# Patient Record
Sex: Female | Born: 1995 | Race: White | Hispanic: No | Marital: Single | Smoking: Current every day smoker
Health system: Southern US, Community
[De-identification: ages and names within clinical notes are randomized; demographics above are authoritative.]

## PROBLEM LIST (undated history)

## (undated) DIAGNOSIS — F32A Depression, unspecified: Secondary | ICD-10-CM

## (undated) DIAGNOSIS — T7840XA Allergy, unspecified, initial encounter: Secondary | ICD-10-CM

## (undated) DIAGNOSIS — J302 Other seasonal allergic rhinitis: Secondary | ICD-10-CM

## (undated) DIAGNOSIS — J45909 Unspecified asthma, uncomplicated: Secondary | ICD-10-CM

## (undated) DIAGNOSIS — F419 Anxiety disorder, unspecified: Secondary | ICD-10-CM

## (undated) HISTORY — DX: Anxiety disorder, unspecified: F41.9

## (undated) HISTORY — DX: Depression, unspecified: F32.A

## (undated) HISTORY — DX: Allergy, unspecified, initial encounter: T78.40XA

## (undated) HISTORY — PX: ADENOIDECTOMY: SUR15

## (undated) HISTORY — PX: TONSILLECTOMY: SUR1361

---
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
## 2017-09-07 ENCOUNTER — Other Ambulatory Visit: Payer: Self-pay

## 2017-09-07 ENCOUNTER — Encounter (HOSPITAL_COMMUNITY): Payer: Self-pay

## 2017-09-07 ENCOUNTER — Ambulatory Visit (HOSPITAL_COMMUNITY)
Admission: RE | Admit: 2017-09-07 | Discharge: 2017-09-07 | Disposition: A | Discharge status: Home / Self Care | Payer: Self-pay | Attending: Psychiatry | Admitting: Psychiatry

## 2017-09-07 ENCOUNTER — Emergency Department (HOSPITAL_COMMUNITY)
Admission: EM | Admit: 2017-09-07 | Discharge: 2017-09-08 | Disposition: A | Discharge status: Other Acute Inpatient Hospital | Payer: Medicaid Other | Attending: Emergency Medicine | Admitting: Emergency Medicine

## 2017-09-07 DIAGNOSIS — F1410 Cocaine abuse, uncomplicated: Secondary | ICD-10-CM | POA: Insufficient documentation

## 2017-09-07 DIAGNOSIS — T50902A Poisoning by unspecified drugs, medicaments and biological substances, intentional self-harm, initial encounter: Secondary | ICD-10-CM | POA: Insufficient documentation

## 2017-09-07 DIAGNOSIS — F1994 Other psychoactive substance use, unspecified with psychoactive substance-induced mood disorder: Secondary | ICD-10-CM

## 2017-09-07 DIAGNOSIS — F322 Major depressive disorder, single episode, severe without psychotic features: Secondary | ICD-10-CM | POA: Insufficient documentation

## 2017-09-07 LAB — COMPREHENSIVE METABOLIC PANEL
ALBUMIN: 4.1 g/dL (ref 3.5–5.0)
ALK PHOS: 41 U/L (ref 38–126)
ALT: 14 U/L (ref 14–54)
AST: 23 U/L (ref 15–41)
Anion gap: 12 (ref 5–15)
BILIRUBIN TOTAL: 0.5 mg/dL (ref 0.3–1.2)
BUN: 5 mg/dL — AB (ref 6–20)
CO2: 23 mmol/L (ref 22–32)
CREATININE: 0.83 mg/dL (ref 0.44–1.00)
Calcium: 9.2 mg/dL (ref 8.9–10.3)
Chloride: 105 mmol/L (ref 101–111)
GFR calc Af Amer: >60 mL/min (ref >60)
GLUCOSE: 95 mg/dL (ref 65–99)
POTASSIUM: 3.4 mmol/L — AB (ref 3.5–5.1)
Sodium: 140 mmol/L (ref 135–145)
TOTAL PROTEIN: 6.8 g/dL (ref 6.5–8.1)

## 2017-09-07 LAB — I-STAT BETA HCG BLOOD, ED (MC, WL, AP ONLY)

## 2017-09-07 LAB — CBC
HEMATOCRIT: 42.4 % (ref 36.0–46.0)
Hemoglobin: 14.3 g/dL (ref 12.0–15.0)
MCH: 30 pg (ref 26.0–34.0)
MCHC: 33.7 g/dL (ref 30.0–36.0)
MCV: 88.9 fL (ref 78.0–100.0)
PLATELETS: 288 10*3/uL (ref 150–400)
RBC: 4.77 MIL/uL (ref 3.87–5.11)
RDW: 13.6 % (ref 11.5–15.5)
WBC: 9.6 10*3/uL (ref 4.0–10.5)

## 2017-09-07 LAB — I-STAT TROPONIN, ED: Troponin i, poc: 0 ng/mL (ref 0.00–0.08)

## 2017-09-07 LAB — ETHANOL

## 2017-09-07 LAB — SALICYLATE LEVEL: Salicylate Lvl: <7 mg/dL (ref 2.8–30.0)

## 2017-09-07 LAB — RAPID URINE DRUG SCREEN, HOSP PERFORMED
AMPHETAMINES: NOT DETECTED
BARBITURATES: NOT DETECTED
BENZODIAZEPINES: NOT DETECTED
Cocaine: NOT DETECTED
Opiates: NOT DETECTED
Tetrahydrocannabinol: POSITIVE — AB

## 2017-09-07 LAB — ACETAMINOPHEN LEVEL: Acetaminophen (Tylenol), Serum: <10 ug/mL — ABNORMAL LOW (ref 10–30)

## 2017-09-07 LAB — CK: Total CK: 137 U/L (ref 38–234)

## 2017-09-07 MED ORDER — SODIUM CHLORIDE 0.9 % IV BOLUS (SEPSIS)
1000.0000 mL | Freq: Once | INTRAVENOUS | Status: AC
Start: 2017-09-07 — End: 2017-09-07
  Administered 2017-09-07: 1000 mL via INTRAVENOUS

## 2017-09-07 MED ORDER — SODIUM CHLORIDE 0.9 % IV BOLUS (SEPSIS)
1000.0000 mL | Freq: Once | INTRAVENOUS | Status: AC
  Administered 2017-09-07: 1000 mL via INTRAVENOUS

## 2017-09-07 MED ORDER — ACETAMINOPHEN 325 MG PO TABS: 650.0000 mg | ORAL_TABLET | ORAL | Status: DC | PRN

## 2017-09-07 MED ORDER — THIAMINE HCL 100 MG/ML IJ SOLN: 100.0000 mg | Freq: Every day | INTRAMUSCULAR | Status: DC

## 2017-09-07 MED ORDER — LORAZEPAM 1 MG PO TABS: 0.0000 mg | ORAL_TABLET | Freq: Two times a day (BID) | ORAL | Status: DC

## 2017-09-07 MED ORDER — LORAZEPAM 2 MG/ML IJ SOLN: 0.0000 mg | Freq: Four times a day (QID) | INTRAMUSCULAR | Status: DC

## 2017-09-07 MED ORDER — LORAZEPAM 1 MG PO TABS
0.0000 mg | ORAL_TABLET | Freq: Four times a day (QID) | ORAL | Status: DC
  Administered 2017-09-08: 1 mg via ORAL
  Filled 2017-09-07: qty 1

## 2017-09-07 MED ORDER — BUPROPION HCL ER (XL) 150 MG PO TB24: 150.0000 mg | ORAL_TABLET | Freq: Every day | ORAL | Status: DC

## 2017-09-07 MED ORDER — LORAZEPAM 2 MG/ML IJ SOLN
1.0000 mg | Freq: Once | INTRAMUSCULAR | Status: AC
  Administered 2017-09-07: 1 mg via INTRAVENOUS
  Filled 2017-09-07: qty 1

## 2017-09-07 MED ORDER — FLUOXETINE HCL 20 MG PO CAPS: 20.0000 mg | ORAL_CAPSULE | Freq: Every day | ORAL | Status: DC

## 2017-09-07 MED ORDER — LORAZEPAM 2 MG/ML IJ SOLN: 0.0000 mg | Freq: Two times a day (BID) | INTRAMUSCULAR | Status: DC

## 2017-09-07 MED ORDER — VITAMIN B-1 100 MG PO TABS
100.0000 mg | ORAL_TABLET | Freq: Every day | ORAL | Status: DC
  Administered 2017-09-08: 100 mg via ORAL
  Filled 2017-09-07: qty 1

## 2017-09-07 NOTE — ED Notes (Signed)
[id number] Expected date:  Expected time:  Means of arrival:  Comments: Rm 11

## 2017-09-07 NOTE — ED Notes (Signed)
Bed: [id number] Expected date:  Expected time:  Means of arrival:  Comments: [facility]-medical clearance

## 2017-09-07 NOTE — ED Provider Notes (Signed)
[facility]-EMERGENCY DEPT Provider Note   CSN: [id number] Arrival date & time: 09/07/17  1753     History   Chief Complaint Chief Complaint  Patient presents with  . Drug / Alcohol Assessment  . Suicidal    HPI Desiree Berger is a 21 y.o. female with a history of suicide attempt at age 21 presents today for evaluation of a suicide attempt.  She reports that this morning she snorted her Wellbutrin instead of taking it p.o. and took "a lot of cocaine" in an attempt to kill herself.  She reports that she felt like she was going to die so she is convinced the cocaine was laced with fentanyl.  She reports some mild chest pain on the left anterior chest and feels like she is having palpitations.  She would not elaborate on why she wanted to kill herself today.  Her mom reports that she is concerned something may have happened on Halloween because since then she has been acting without regard for others.    Patient reports that she uses "a lot" of cocaine in a daily basis.  She says that her ex-boyfriend used to give it to her, however notes that it was willing and not against her will.  She says that she drinks approximately 50 large beers a week.  She reports that marijuana is her favorite drug and she smokes it daily however she also takes cocaine regularly.  She says that this morning she snorted her welbutrin.  She denies any opioid use.  She denies any shortness of breath or abdominal pain.  Remainder of history limited secondary to patient being extremely labile.   HPI  Past Medical History:  Diagnosis Date  . Asthma   . Seasonal allergies     Patient Active Problem List   Diagnosis Date Noted  . MDD (major depressive disorder), single episode, severe (HCC) 01/16/2014  . GAD (generalized anxiety disorder) 01/16/2014  . Closed fracture of left toe 04/14/2012    Past Surgical History:  Procedure Laterality Date  . ADENOIDECTOMY    .  TONSILLECTOMY      OB History    No data available       Home Medications    Prior to Admission medications   Medication Sig Start Date End Date Taking? Authorizing Provider  buPROPion (WELLBUTRIN XL) 150 MG 24 hr tablet Take 150 mg daily by mouth. 08/17/17  Yes [provider]  etonogestrel (NEXPLANON) 68 MG IMPL implant 1 each once by Subdermal route.   Yes [provider]  FLUoxetine (PROZAC) 20 MG capsule  08/15/17  Yes [provider]  mirtazapine (REMERON) 15 MG tablet Take 1 tablet (15 mg total) by mouth at bedtime. Patient not taking: Reported on 09/07/2017 01/22/14   Jolene SchimkeWinson, Kim B, NP    Family History Family History  Problem Relation Age of Onset  . Hypertension Mother   . Mental illness Mother   . Hyperlipidemia Father   . Birth defects Maternal Grandmother   . Heart attack Neg Hx   . Diabetes Neg Hx   . Sudden death Neg Hx     Social History Social History   Tobacco Use  . Smoking status: Passive Smoke Exposure - Never Smoker  . Smokeless tobacco: Never Used  Substance Use Topics  . Alcohol use: No  . Drug use: Yes    Types: Cocaine    Comment: Stated cocaine ingestion x 2 hours.     Allergies  Patient has no known allergies.   Review of Systems Review of Systems  Constitutional: Negative for chills and fever.  HENT: Negative for ear pain and sore throat.   Eyes: Negative for pain and visual disturbance.  Respiratory: Negative for cough and shortness of breath.   Cardiovascular: Positive for chest pain and palpitations.  Gastrointestinal: Negative for abdominal pain and vomiting.  Genitourinary: Negative for dysuria and hematuria.  Musculoskeletal: Negative for arthralgias and back pain.  Skin: Negative for color change and rash.  Neurological: Negative for seizures, syncope and headaches.  Psychiatric/Behavioral: Positive for confusion, decreased concentration, self-injury, sleep disturbance and suicidal ideas. The  patient is nervous/anxious.   All other systems reviewed and are negative.    Physical Exam Updated Vital Signs BP (!) 154/86   Pulse 81   Temp 99.1 F (37.3 C) (Oral)   Resp (!) 22   Ht 5\' 5"  (1.651 m)   Wt 81.6 kg (180 lb)   SpO2 99%   BMI 29.95 kg/m   Physical Exam  Constitutional: She appears well-developed and well-nourished. No distress.  HENT:  Head: Normocephalic and atraumatic.  Eyes: Conjunctivae are normal.  Neck: Neck supple.  Cardiovascular: Regular rhythm, normal heart sounds, intact distal pulses and normal pulses. Tachycardia present. Exam reveals no friction rub.  No murmur heard. Pulmonary/Chest: Effort normal and breath sounds normal. No respiratory distress.  Abdominal: Soft. There is no tenderness.  Musculoskeletal: She exhibits no edema.  Neurological: She is alert.  Skin: Skin is warm and dry.  Psychiatric: Her mood appears anxious. Her affect is angry, labile and inappropriate. Her speech is rapid and/or pressured. She is agitated and hyperactive. She expresses suicidal ideation. She expresses no homicidal ideation. She expresses suicidal plans. She expresses no homicidal plans. She exhibits abnormal recent memory.  Nursing note and vitals reviewed.    ED Treatments / Results  Labs (all labs ordered are listed, but only abnormal results are displayed) Labs Reviewed  COMPREHENSIVE METABOLIC PANEL - Abnormal; Notable for the following components:      Result Value   Potassium 3.4 (*)    BUN 5 (*)    All other components within normal limits  ACETAMINOPHEN LEVEL - Abnormal; Notable for the following components:   Acetaminophen (Tylenol), Serum <10 (*)    All other components within normal limits  RAPID URINE DRUG SCREEN, HOSP PERFORMED - Abnormal; Notable for the following components:   Tetrahydrocannabinol POSITIVE (*)    All other components within normal limits  ETHANOL  SALICYLATE LEVEL  CBC  CK  I-STAT BETA HCG BLOOD, ED ([facility], [facility], [facility]  ONLY)  I-STAT TROPONIN, ED    EKG  EKG Interpretation None       Radiology No results found.  Procedures Procedures (including critical care time)  Medications Ordered in ED Medications  LORazepam (ATIVAN) injection 1 mg (1 mg Intravenous Given 09/07/17 1859)  sodium chloride 0.9 % bolus 1,000 mL (0 mLs Intravenous Stopped 09/07/17 1949)  sodium chloride 0.9 % bolus 1,000 mL (1,000 mLs Intravenous New Bag/Given 09/07/17 2041)     Initial Impression / Assessment and Plan / ED Course  I have reviewed the triage vital signs and the nursing notes.  Pertinent labs & imaging results that were available during my care of the patient were reviewed by me and considered in my medical decision making (see chart for details).  Clinical Course as of Sep 07 2233  Tue Sep 07, 2017  2232 , She is resting comfortably at  this time, she is no longer tachycardic.  Patiently medically cleared for psychiatric evaluation and placement.  [EH]    Clinical Course User Index [EH] Cristina GongHammond, Jaide Hillenburg W, PA-C   Desiree BrodKatherine Michelle Johanson presents today for evaluation after a reported suicide attempt.  She reports that she attempted to kill herself today by ingesting a large amount of what she thought was cocaine.  Initially she was mildly tachycardic, afebrile, normotensive.  Screening labs were obtained, UDS negative for cocaine, positive for marijuana.  While she reports that she drinks approximately 50 beers a week we will place her on CIWA protocol.  She denies any homicidal ideations, or AVH.  At this time she does not have any physical complaints or concerns.  Her tachycardia was treated with Ativan, and 2 L normal saline which resolved her tachycardia.  Patient was brought here by family and [employer].  She is here voluntarily but if wants to leave will need IVC as she took what she thought would be a fatal dose of cocaine this morning.   Final Clinical Impressions(s) / ED Diagnoses   Final diagnoses:    Suicide attempt by drug ingestion, initial encounter [facility](HCC)    ED Discharge Orders    None       Cristina GongHammond, Zemira Zehring W, PA-C 09/07/17 2238    Cristina GongHammond, Tyauna Lacaze W, PA-C 09/07/17 2239    Tegeler, Canary Brimhristopher J, MD 09/08/17 0126

## 2017-09-07 NOTE — ED Notes (Signed)
Pt presents with SI, plan to OD.  Snorted Wellbutrin today.  No sleep in 3 days.  History of Anxiety DO and Major Depressive DO.  A&O x 3, no distress noted, calm & cooperative. Monitoring for safety, Q 15 min checks in effect.  Safety check for contraband completed, no items found.  Pt being assessed by TTS Thurston Poundsrey at present.
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
## 2017-09-07 NOTE — ED Notes (Signed)
Pt reported that she "SNORTED her Wellbutrin tablet".

## 2017-09-07 NOTE — ED Triage Notes (Addendum)
Pt arrived escorted by [employer] after stating she took cocaine earlier and became combative. Pt states she has suicide plan and wishes to commit suicide by overdose.

## 2017-09-07 NOTE — ED Notes (Signed)
Pt unable to give urine sample, but aware that we need one.  
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
## 2017-09-08 ENCOUNTER — Encounter (HOSPITAL_COMMUNITY): Payer: Self-pay

## 2017-09-08 ENCOUNTER — Inpatient Hospital Stay (HOSPITAL_COMMUNITY)
Admission: AD | Admit: 2017-09-08 | Discharge: 2017-09-13 | DRG: 885 | Disposition: A | Discharge status: Home / Self Care | Payer: Medicaid Other | Attending: Psychiatry | Admitting: Psychiatry

## 2017-09-08 ENCOUNTER — Other Ambulatory Visit: Payer: Self-pay

## 2017-09-08 DIAGNOSIS — F22 Delusional disorders: Secondary | ICD-10-CM | POA: Diagnosis not present

## 2017-09-08 DIAGNOSIS — F1994 Other psychoactive substance use, unspecified with psychoactive substance-induced mood disorder: Secondary | ICD-10-CM | POA: Diagnosis present

## 2017-09-08 DIAGNOSIS — R441 Visual hallucinations: Secondary | ICD-10-CM | POA: Diagnosis not present

## 2017-09-08 DIAGNOSIS — R4587 Impulsiveness: Secondary | ICD-10-CM | POA: Diagnosis not present

## 2017-09-08 DIAGNOSIS — Z79899 Other long term (current) drug therapy: Secondary | ICD-10-CM | POA: Diagnosis not present

## 2017-09-08 DIAGNOSIS — G4700 Insomnia, unspecified: Secondary | ICD-10-CM | POA: Diagnosis present

## 2017-09-08 DIAGNOSIS — Z818 Family history of other mental and behavioral disorders: Secondary | ICD-10-CM

## 2017-09-08 DIAGNOSIS — R440 Auditory hallucinations: Secondary | ICD-10-CM

## 2017-09-08 DIAGNOSIS — F419 Anxiety disorder, unspecified: Secondary | ICD-10-CM | POA: Diagnosis not present

## 2017-09-08 DIAGNOSIS — F333 Major depressive disorder, recurrent, severe with psychotic symptoms: Principal | ICD-10-CM | POA: Diagnosis present

## 2017-09-08 DIAGNOSIS — F17210 Nicotine dependence, cigarettes, uncomplicated: Secondary | ICD-10-CM | POA: Diagnosis present

## 2017-09-08 DIAGNOSIS — J45909 Unspecified asthma, uncomplicated: Secondary | ICD-10-CM | POA: Diagnosis present

## 2017-09-08 DIAGNOSIS — R451 Restlessness and agitation: Secondary | ICD-10-CM | POA: Diagnosis not present

## 2017-09-08 DIAGNOSIS — K5900 Constipation, unspecified: Secondary | ICD-10-CM | POA: Diagnosis not present

## 2017-09-08 DIAGNOSIS — R45851 Suicidal ideations: Secondary | ICD-10-CM

## 2017-09-08 DIAGNOSIS — R450 Nervousness: Secondary | ICD-10-CM

## 2017-09-08 DIAGNOSIS — R4582 Worries: Secondary | ICD-10-CM | POA: Diagnosis not present

## 2017-09-08 DIAGNOSIS — Z6379 Other stressful life events affecting family and household: Secondary | ICD-10-CM | POA: Diagnosis not present

## 2017-09-08 DIAGNOSIS — F411 Generalized anxiety disorder: Secondary | ICD-10-CM | POA: Diagnosis present

## 2017-09-08 DIAGNOSIS — F1410 Cocaine abuse, uncomplicated: Secondary | ICD-10-CM

## 2017-09-08 DIAGNOSIS — F1290 Cannabis use, unspecified, uncomplicated: Secondary | ICD-10-CM | POA: Diagnosis present

## 2017-09-08 DIAGNOSIS — R4586 Emotional lability: Secondary | ICD-10-CM

## 2017-09-08 MED ORDER — HYDROXYZINE HCL 25 MG PO TABS
25.0000 mg | ORAL_TABLET | Freq: Three times a day (TID) | ORAL | Status: DC
  Administered 2017-09-08 – 2017-09-10 (×6): 25 mg via ORAL
  Filled 2017-09-08 (×9): qty 1

## 2017-09-08 MED ORDER — TRAZODONE HCL 50 MG PO TABS
50.0000 mg | ORAL_TABLET | Freq: Every evening | ORAL | Status: DC | PRN
  Administered 2017-09-09 – 2017-09-12 (×4): 50 mg via ORAL
  Filled 2017-09-08 (×3): qty 1

## 2017-09-08 MED ORDER — RISPERIDONE 0.5 MG PO TABS
0.5000 mg | ORAL_TABLET | Freq: Every day | ORAL | Status: DC
  Administered 2017-09-08: 0.5 mg via ORAL
  Filled 2017-09-08: qty 1

## 2017-09-08 MED ORDER — CITALOPRAM HYDROBROMIDE 10 MG PO TABS
20.0000 mg | ORAL_TABLET | Freq: Every day | ORAL | Status: DC
  Administered 2017-09-08: 20 mg via ORAL
  Filled 2017-09-08: qty 2

## 2017-09-08 MED ORDER — MAGNESIUM HYDROXIDE 400 MG/5ML PO SUSP
30.0000 mL | Freq: Every day | ORAL | Status: DC | PRN
  Administered 2017-09-11: 30 mL via ORAL
  Filled 2017-09-08: qty 30

## 2017-09-08 MED ORDER — RISPERIDONE 0.5 MG PO TABS
0.5000 mg | ORAL_TABLET | Freq: Every day | ORAL | Status: DC
  Filled 2017-09-08 (×2): qty 1

## 2017-09-08 MED ORDER — RISPERIDONE 1 MG PO TABS: 1.0000 mg | ORAL_TABLET | Freq: Every day | ORAL | Status: DC

## 2017-09-08 MED ORDER — ACETAMINOPHEN 325 MG PO TABS: 650.0000 mg | ORAL_TABLET | Freq: Four times a day (QID) | ORAL | Status: DC | PRN

## 2017-09-08 MED ORDER — ALUM & MAG HYDROXIDE-SIMETH 200-200-20 MG/5ML PO SUSP: 30.0000 mL | ORAL | Status: DC | PRN

## 2017-09-08 MED ORDER — CITALOPRAM HYDROBROMIDE 20 MG PO TABS
20.0000 mg | ORAL_TABLET | Freq: Every day | ORAL | Status: DC
  Administered 2017-09-09 – 2017-09-13 (×5): 20 mg via ORAL
  Filled 2017-09-08 (×8): qty 1

## 2017-09-08 NOTE — Progress Notes (Signed)
Pt did not attend wrap-up group, is sleeping.
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
## 2017-09-08 NOTE — ED Notes (Signed)
[facility].

## 2017-09-08 NOTE — Progress Notes (Deleted)
09/08/17 1405:  Pt was sitting in dayroom watching television.  LRT introduced self and offered activities.  Pt was quiet but would respond when prompted.  Pt seemed guarded but appropriate.  LRT and pt played cards for awhile.  Pt requested mermaid and princess coloring sheets for later.   Caroll RancherMarjette Asencion Guisinger, LRT/CTRS

## 2017-09-08 NOTE — ED Notes (Signed)
[facility]. She was calm and cooperative. All belongings returned to pt who signed for them.

## 2017-09-08 NOTE — Progress Notes (Signed)
09/08/17 1404:  Pt sleeping in room.   Caroll RancherMarjette Haley Roza, LRT/CTRS
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
## 2017-09-08 NOTE — BH Assessment (Addendum)
[facility] Assessment Progress Note  Per Juanetta BeetsJacqueline Norman, DO, this pt requires psychiatric hospitalization.  Malva LimesLinsey Strader, RN, [facility]; they will call when they are ready to receive pt.  Dr Sharma CovertNorman also finds that pt meeds criteria for IVC, which she has initiated.  [place], and at 13:07 Hart CarwinMagistrate Haynes confirms receipt.  As of this writing, service of Findings and Custody Order is pending. Other [facility].  Pt's nurse, Diane, has been notified, and agrees to call report to [phone].  Pt is to be transported via [employer] examinerlaw enforcement when the time comes.   Doylene Canninghomas Thaddaeus Granja, MA Triage Specialist [phone]   Addendum:  Per Richelle ItoLinsey, [facility] between 16:30 and 17:00.  Laveda AbbeLaurie Britton Parks, FNP has been notified, as well as pt's nurse, Diane.  Doylene Canninghomas Enaya Howze, MA Triage Specialist [phone]
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
## 2017-09-08 NOTE — ED Notes (Signed)
Pt is anxious and cries easily. Medication given as well as therapeutic communication. Her mother and other family members are supportive and come to visit her. Pt is cooperative. She mostly stays in her room and watches TV.

## 2017-09-08 NOTE — Progress Notes (Signed)
Pt sleeping in bed. Will continue to monitor.

## 2017-09-08 NOTE — Tx Team (Signed)
Initial Treatment Plan 09/08/2017 6:20 PM Desiree BrodKatherine Michelle Heney EXB:[id number]    PATIENT STRESSORS: Legal issue Substance abuse Traumatic event   PATIENT STRENGTHS: Communication skills General fund of knowledge Physical Health   PATIENT IDENTIFIED PROBLEMS: Substance abuse  Suicidal ideation  Depression  "Have help for addiction problem"  "Help with suicidal thoughts"  "To be able to go through something without wanting to jump off bridge"           DISCHARGE CRITERIA:  Ability to meet basic life and health needs Improved stabilization in mood, thinking, and/or behavior Need for constant or close observation no longer present Verbal commitment to aftercare and medication compliance Withdrawal symptoms are absent or subacute and managed without 24-hour nursing intervention  PRELIMINARY DISCHARGE PLAN: Outpatient therapy Medication management  PATIENT/FAMILY INVOLVEMENT: This treatment plan has been presented to and reviewed with the patient, Desiree Berger.  The patient and family have been given the opportunity to ask questions and make suggestions.  Levin BaconHeather V Bettina Warn, RN 09/08/2017, 6:20 PM

## 2017-09-09 ENCOUNTER — Ambulatory Visit (HOSPITAL_COMMUNITY): Payer: Self-pay

## 2017-09-09 DIAGNOSIS — F419 Anxiety disorder, unspecified: Secondary | ICD-10-CM

## 2017-09-09 DIAGNOSIS — F17210 Nicotine dependence, cigarettes, uncomplicated: Secondary | ICD-10-CM

## 2017-09-09 DIAGNOSIS — R4587 Impulsiveness: Secondary | ICD-10-CM

## 2017-09-09 DIAGNOSIS — R4582 Worries: Secondary | ICD-10-CM

## 2017-09-09 DIAGNOSIS — G4700 Insomnia, unspecified: Secondary | ICD-10-CM

## 2017-09-09 DIAGNOSIS — Z6379 Other stressful life events affecting family and household: Secondary | ICD-10-CM

## 2017-09-09 DIAGNOSIS — F333 Major depressive disorder, recurrent, severe with psychotic symptoms: Principal | ICD-10-CM

## 2017-09-09 DIAGNOSIS — R4586 Emotional lability: Secondary | ICD-10-CM

## 2017-09-09 DIAGNOSIS — F1994 Other psychoactive substance use, unspecified with psychoactive substance-induced mood disorder: Secondary | ICD-10-CM

## 2017-09-09 DIAGNOSIS — R450 Nervousness: Secondary | ICD-10-CM

## 2017-09-09 DIAGNOSIS — R440 Auditory hallucinations: Secondary | ICD-10-CM

## 2017-09-09 DIAGNOSIS — R441 Visual hallucinations: Secondary | ICD-10-CM

## 2017-09-09 DIAGNOSIS — R45851 Suicidal ideations: Secondary | ICD-10-CM

## 2017-09-09 LAB — LIPID PANEL
Cholesterol: 145 mg/dL (ref 0–200)
HDL: 39 mg/dL — ABNORMAL LOW (ref >40)
LDL CALC: 82 mg/dL (ref 0–99)
Total CHOL/HDL Ratio: 3.7 RATIO
Triglycerides: 122 mg/dL (ref <150)
VLDL: 24 mg/dL (ref 0–40)

## 2017-09-09 MED ORDER — RISPERIDONE 0.5 MG PO TABS
0.5000 mg | ORAL_TABLET | Freq: Two times a day (BID) | ORAL | Status: DC
  Administered 2017-09-09 – 2017-09-10 (×3): 0.5 mg via ORAL
  Filled 2017-09-09 (×7): qty 1
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
## 2017-09-09 NOTE — Social Work (Signed)
Referred to [facility], is [place] resident.  Anne Cunningham, LCSW Lead Clinical Social Worker Phone:  [phone]  

## 2017-09-09 NOTE — [facility] Suicide Risk Assessment (Signed)
[facility] INPATIENT:  Family/Significant Other Suicide Prevention Education  Suicide Prevention Education:  Education Completed; Desiree HanksMichelle Berger [phone] (Mother) has been identified by the patient as the family member/significant other with whom the patient will be residing, and identified as the person(s) who will aid the patient in the event of a mental health crisis (suicidal ideations/suicide attempt).  With written consent from the patient, the family member/significant other has been provided the following suicide prevention education, prior to the and/or following the discharge of the patient.  The suicide prevention education provided includes the following:  Suicide risk factors  Suicide prevention and interventions  National Suicide Hotline telephone number  [facility] assessment telephone number  [facility] Emergency Assistance 911  [facility] and/or Residential Mobile Crisis Unit telephone number  Request made of family/significant other to:  Remove weapons (e.g., guns, rifles, knives), all items previously/currently identified as safety concern.    Remove drugs/medications (over-the-counter, prescriptions, illicit drugs), all items previously/currently identified as a safety concern.  The family member/significant other verbalizes understanding of the suicide prevention education information provided.  The family member/significant other agrees to remove the items of safety concern listed above.  Desiree HanksMichelle Berger (Mother) states that she believes her daughter has a significant drug problem.  She shared that Desiree LeatherwoodKatherine has been taking Prozac with alcohol.  She states her daughter has told her that she has taken Cocaine, Xanax, Marijuana.  Her mother feels that she may have been taking Methamphetamine as well due to Desiree Berger's rapid weight loss.  Desiree HanksMichelle Berger states that she has found capsules, a spoon, baggies and baby powder hidden in Foot LockerKatherine's dresser  drawer.  Desiree HanksMichelle Berger often hides her medication on her body to keep it hidden from her daughter.  According to her mother Desiree LeatherwoodKatherine is stealing, lying, shows no remorse for her actions, blames things on others, and has crying spells.  He mother also states that she has found semi-nude photos of her daughter on her phone and that she has been contacting various men off the Internet.  Her mother worries that she has a personality disorder or another mental health concern.    Her twin brother, Desiree Berger, called to share that when he brought his sister to the hospital she was acting strangely and he asked her if she had taken "blow".  She told him yes.  However, a friend contacted him Wednesday and informed him that what she was given was actually Methamphetamines.  He states that when he brought her to the hospital she had not slept in 3 days.     Desiree Berger 09/09/2017, 10:42 AM

## 2017-09-09 NOTE — Progress Notes (Signed)
Recreation Therapy Notes  Date: 09/09/17 Time: 1015 Location: [address] Dayroom  Group Topic: Communication  Goal Area(s) Addresses:  Patient will effectively communicate with peers in group.  Patient will verbalize benefit of healthy communication. Patient will verbalize positive effect of healthy communication on post d/c goals.  Patient will identify communication techniques that made activity effective for group.   Behavioral Response: Engaged  Intervention:  Merchandiser, retailGeometrical drawings, pencils, blank paper  Activity: Back to Back Drawings.  Patients were divided into groups of 2.  One person gave the instructions and the other drew the picture according to the instructions given.  The person drawing was not allowed to ask any questions of the person giving the directions.  Patients switched roles in the second round.   Education: Communication, Discharge Planning  Education Outcome: Acknowledges understanding/In group clarification offered/Needs additional education.   Clinical Observations/Feedback: Pt expressed the main elements of communication were talking and listening.  Pt stated "knowing what you are talking about" is key to communication as well.  Pt stated it was harder being the listener because "the outside noise was distracting, so I had to strain to listen".  Pt also stated it was easier to give the instructions but she had to trace the picture with her finger to describe it correctly.  Pt did become emotional and started crying because she drew her picture close to what was described to her.  Pt stated she usually gets things wrong so it was a big deal to her to close to the picture.  Pt also stated communication is important so you can "understand the person you are talking to".   Pt expressed getting the person to repeat what you say or using your hands can help limit misunderstandings.   Caroll RancherMarjette Ghazal Berger, LRT/CTRS        Caroll RancherLindsay, [name] 09/09/2017 11:31 AM

## 2017-09-09 NOTE — Progress Notes (Signed)
D: Patient denies SI, HI or AVH. Patient presents as animated and anxious with pleasant mood.  She states she "slept like a baby" and reports that her appetite is good.  Pt. Became tearful during lunch, wanting to return to the unit.  She has been somewhat isolative because she is afraid of some of the other patients.  Pt. Reassured that she was safe, medications administered as ordered and patient resting without apparent distress at this time.  A: Patient given emotional support from RN. Patient encouraged to come to staff with concerns and/or questions. Patient's medication routine continued. Patient's orders and plan of care reviewed.   R: Patient remains appropriate and cooperative. Will continue to monitor patient q15 minutes for safety.

## 2017-09-09 NOTE — [facility]HH Suicide Risk Assessment (Signed)
[facility]   Nursing information obtained from:  Patient Demographic factors:  Caucasian, Adolescent or young adult Current Mental Status:  NA Loss Factors:  Legal issues, Financial problems / change in socioeconomic status Historical Factors:  Prior suicide attempts, Family history of suicide, Family history of mental illness or substance abuse, Impulsivity Risk Reduction Factors:  Employed, Living with another person, especially a relative  Total Time spent with patient: 45 minutes Principal Problem: MDD (major depressive disorder), recurrent, severe, with psychosis (HCC) Diagnosis:   Patient Active Problem List   Diagnosis Date Noted  . Substance induced mood disorder (HCC) [F19.94] 09/08/2017  . MDD (major depressive disorder), recurrent, severe, with psychosis (HCC) [F33.3] 09/08/2017  . MDD (major depressive disorder), single episode, severe (HCC) [F32.2] 01/16/2014  . GAD (generalized anxiety disorder) [F41.1] 01/16/2014  . Closed fracture of left toe [S92.912A] 04/14/2012   Subjective Data:  Desiree PittsKatherine Delmar is a 21 y/o F with history of MDD and GAD who was admitted on IVC with worsening depression, SI, psychosis, and illicit substance use. Pt shares her reasons for presenting initially stating, "I called the police because I thought my mom said there was a dead body under the house, but then when they came I had trouble getting my words out." Additional collateral information is that patient had expressed suicidal ideation with plan to overdose, and she confirms that she was thinking of suicide via "snorting a bunch of my wellbutrin pills." Pt endroses that she has been insufflating her wellbutrin medication for the last several months after she lost access to cocaine. She developed SI with plan to overdose in the recent days. She denies HI. She endorses AH of hearing "names switched around," "bells," and "dings." She endorses VH of seeing "shadow figures" and  "bright lights." Pt endorses depression symptoms of poor sleep with initial and middle insomnia, low energy, guilty feelings, anhedonia, low motivation, poor appetite, and suicidal thoughts. She denies symptoms of mania, OCD, and PTSD. She reports that she previously was taking wellbutrin and prozac outside the hospital, but since her admission she was started on celexa and risperdal. At time of interview, she is distressed due to ongoing AH, and we agreed to increase dose of risperdal. Pt had no further questions, comments, or concerns.   Continued Clinical Symptoms:  Alcohol Use Disorder Identification Test Final Score (AUDIT): 6 The "Alcohol Use Disorders Identification Test", Guidelines for Use in Primary Care, Second Edition.  World Science writerHealth Organization [facility](WHO). Score between 0-7:  no or low risk or alcohol related problems. Score between 8-15:  moderate risk of alcohol related problems. Score between 16-19:  high risk of alcohol related problems. Score 20 or above:  warrants further diagnostic evaluation for alcohol dependence and treatment.   CLINICAL FACTORS:   Severe Anxiety and/or Agitation Depression:   Comorbid alcohol abuse/dependence Insomnia Alcohol/Substance Abuse/Dependencies   Musculoskeletal: Strength & Muscle Tone: within normal limits Gait & Station: normal Patient leans: N/A  Psychiatric Specialty Exam: Physical Exam  Nursing note and vitals reviewed.   Review of Systems  Constitutional: Negative for chills and fever.  Respiratory: Negative for cough.   Cardiovascular: Negative for chest pain.  Gastrointestinal: Negative for abdominal pain, heartburn, nausea and vomiting.  Psychiatric/Behavioral: Positive for depression, hallucinations, substance abuse and suicidal ideas. The patient is nervous/anxious.     Blood pressure 120/77, pulse 79, temperature 98 F (36.7 C), resp. rate 18, height 5\' 5"  (1.651 m), weight 81.2 kg (179 lb), SpO2 100 %.Body mass  index is  29.79 kg/m.  General Appearance: Casual  Eye Contact:  Fair  Speech:  Clear and Coherent and Normal Rate  Volume:  Normal  Mood:  Anxious and Depressed  Affect:  Congruent and Constricted  Thought Process:  Coherent  Orientation:  Full (Time, Place, and Person)  Thought Content:  Hallucinations: Auditory Visual  Suicidal Thoughts:  Yes.  with intent/plan  Homicidal Thoughts:  No  Memory:  Immediate;   Good Recent;   Good Remote;   Good  Judgement:  Impaired  Insight:  Lacking  Psychomotor Activity:  Normal  Concentration:  Concentration: Good  Recall:  Fair  Fund of Knowledge:  Fair  Language:  Fair  Akathisia:  No  Handed:    AIMS (if indicated):     Assets:  Communication Skills Desire for Improvement Physical Health Resilience Social Support  ADL's:  Intact  Cognition:  WNL  Sleep:         COGNITIVE FEATURES THAT CONTRIBUTE TO RISK:  None    SUICIDE RISK:   Mild:  Suicidal ideation of limited frequency, intensity, duration, and specificity.  There are no identifiable plans, no associated intent, mild dysphoria and related symptoms, good self-control (both objective and subjective assessment), few other risk factors, and identifiable protective factors, including available and accessible social support.  PLAN OF CARE:  - Admit to inpatient psychiatry unit - MDD with psychotic features  - Change risperdal 0.5mg  qhs to risperdal 0.5mg  BID (first dose now)  - Continue celexa 20mg  qDay - Encourage participation in groups and the therapeutic milieu - Discharge planning will be ongoing  I certify that inpatient services furnished can reasonably be expected to improve the patient's condition.   Micheal Likenshristopher T Bruce Churilla, MD 09/09/2017, 3:32 PM
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
## 2017-09-09 NOTE — Tx Team (Signed)
Interdisciplinary Treatment and Diagnostic Plan Update  09/09/2017 Time of Session: 10:43 AM  [name] MRN: [id number]  Principal Diagnosis: Major depressive disorder, recurrent with psychosis, Generalized anxiety disorder, substance induced mood disorder, polysubstance use  Secondary Diagnoses: Active Problems:   MDD (major depressive disorder), recurrent, severe, with psychosis (HCC)   Current Medications:  Current Facility-Administered Medications  Medication Dose Route Frequency Provider Last Rate Last Dose  . acetaminophen (TYLENOL) tablet 650 mg  650 mg Oral Q6H PRN Laveda AbbeParks, Laurie Britton, NP      . alum & mag hydroxide-simeth (MAALOX/MYLANTA) 200-200-20 MG/5ML suspension 30 mL  30 mL Oral Q4H PRN Laveda AbbeParks, Laurie Britton, NP      . citalopram (CELEXA) tablet 20 mg  20 mg Oral Daily Laveda AbbeParks, Laurie Britton, NP   20 mg at 09/09/17 0846  . hydrOXYzine (ATARAX/VISTARIL) tablet 25 mg  25 mg Oral TID Laveda AbbeParks, Laurie Britton, NP   25 mg at 09/09/17 0846  . magnesium hydroxide (MILK OF MAGNESIA) suspension 30 mL  30 mL Oral Daily PRN Laveda AbbeParks, Laurie Britton, NP      . risperiDONE (RISPERDAL) tablet 0.5 mg  0.5 mg Oral QHS Laveda AbbeParks, Laurie Britton, NP      . traZODone (DESYREL) tablet 50 mg  50 mg Oral QHS PRN Laveda AbbeParks, Laurie Britton, NP        PTA Medications: Medications Prior to Admission  Medication Sig Dispense Refill Last Dose  . buPROPion (WELLBUTRIN XL) 150 MG 24 hr tablet Take 150 mg daily by mouth.  0 09/07/2017 at Unknown time  . etonogestrel (NEXPLANON) 68 MG IMPL implant 1 each once by Subdermal route.   continuous  . FLUoxetine (PROZAC) 20 MG capsule   0 09/07/2017 at Unknown time  . mirtazapine (REMERON) 15 MG tablet Take 1 tablet (15 mg total) by mouth at bedtime. (Patient not taking: Reported on 09/07/2017) 30 tablet 1 Completed Course at Unknown time    Treatment Modalities: Medication Management, Group therapy, Case management,  1 to 1 session with clinician,  Psychoeducation, Recreational therapy.  Patient Stressors: Legal issue Substance abuse Traumatic event  Patient Strengths: Wellsite geologistCommunication skills General fund of knowledge Physical Health   Physician Treatment Plan for Primary Diagnosis: Major depressive disorder, recurrent with psychosis, Generalized anxiety disorder, substance induced mood disorder, polysubstance use Long Term Goal(s): Improvement in symptoms so as ready for discharge  Short Term Goals: Ability to identify changes in lifestyle to reduce recurrence of condition will improve Ability to demonstrate self-control will improve Ability to identify and develop effective coping behaviors will improve Compliance with prescribed medications will improve Ability to identify triggers associated with substance abuse/mental health issues will improve  Medication Management: Evaluate patient's response, side effects, and tolerance of medication regimen.  Therapeutic Interventions: 1 to 1 sessions, Unit Group sessions and Medication administration.  Evaluation of Outcomes: Progressing  Physician Treatment Plan for Secondary Diagnosis: Active Problems:   MDD (major depressive disorder), recurrent, severe, with psychosis (HCC)  Long Term Goal(s): Improvement in symptoms so as ready for discharge  Short Term Goals: Ability to identify changes in lifestyle to reduce recurrence of condition will improve Ability to demonstrate self-control will improve Ability to identify and develop effective coping behaviors will improve Compliance with prescribed medications will improve Ability to identify triggers associated with substance abuse/mental health issues will improve  Medication Management: Evaluate patient's response, side effects, and tolerance of medication regimen.  Therapeutic Interventions: 1 to 1 sessions, Unit Group sessions and Medication administration.  Evaluation of Outcomes:  Progressing   RN Treatment Plan for  Primary Diagnosis: Major depressive disorder, recurrent with psychosis, Generalized anxiety disorder, substance induced mood disorder, polysubstance use Long Term Goal(s): Knowledge of disease and therapeutic regimen to maintain health will improve  Short Term Goals: Ability to remain free from injury will improve, Ability to disclose and discuss suicidal ideas, Ability to identify and develop effective coping behaviors will improve and Compliance with prescribed medications will improve  Medication Management: RN will administer medications as ordered by provider, will assess and evaluate patient's response and provide education to patient for prescribed medication. RN will report any adverse and/or side effects to prescribing provider.  Therapeutic Interventions: 1 on 1 counseling sessions, Psychoeducation, Medication administration, Evaluate responses to treatment, Monitor vital signs and CBGs as ordered, Perform/monitor CIWA, COWS, AIMS and Fall Risk screenings as ordered, Perform wound care treatments as ordered.  Evaluation of Outcomes: Progressing   LCSW Treatment Plan for Primary Diagnosis: Major depressive disorder, recurrent with psychosis, Generalized anxiety disorder, substance induced mood disorder, polysubstance use Long Term Goal(s): Safe transition to appropriate next level of care at discharge, Engage patient in therapeutic group addressing interpersonal concerns.  Short Term Goals: Engage patient in aftercare planning with referrals and resources, Facilitate acceptance of mental health diagnosis and concerns, Facilitate patient progression through stages of change regarding substance use diagnoses and concerns, Identify triggers associated with mental health/substance abuse issues and Increase skills for wellness and recovery  Therapeutic Interventions: Assess for all discharge needs, 1 to 1 time with Social worker, Explore available resources and support systems, Assess for  adequacy in community support network, Educate family and significant other(s) on suicide prevention, Complete Psychosocial Assessment, Interpersonal group therapy.  Evaluation of Outcomes: Progressing   Progress in Treatment: Attending groups: Yes Participating in groups: Yes Taking medication as prescribed: Yes Toleration of medication: Yes, no side effects reported at this time Family/Significant other contact made: Yes, Richardo HanksMichelle [place]field [phone]) [phone] Patient understands diagnosis: Yes AEB asking for help with substance use treatment  Discussing patient identified problems/goals with staff: Yes Medical problems stabilized or resolved: Yes Denies suicidal/homicidal ideation: Yes Issues/concerns per patient self-inventory: None Other: N/A  New problem(s) identified: None identified at this time.   New Short Term/Long Term Goal(s):  Pt states "I would like help with my addiction, but I don't want to go to a rehab.  I would like to stay at home and do it".   Discharge Plan or Barriers: Upon discharge the pt will return home with her mother   Reason for Continuation of Hospitalization: Anxiety Depression Hallucinations Medication stabilization Suicidal ideation   Estimated Length of Stay: 09/14/17  Attendees: Patient: Desiree PittsKatherine Marlar  09/09/2017  10:43 AM  Physician: Jolyne Loahristopher Rainville, MD 09/09/2017  10:43 AM  Nursing: Estella HuskElizabeth Awofabeju, RN 09/09/2017  10:43 AM  RN Care Manager: Onnie BoerJennifer Clark, RN 09/09/2017  10:43 AM  Social Worker: Richelle Itood North, LCSW; Melba CoonAngel Cyris Maalouf, Social Work Intern 09/09/2017  10:43 AM  Recreational Therapist: Caroll RancherMarjette [name], LRT 09/09/2017  10:43 AM  Other: Tomasita Morrowelora Sutton, P4CC 09/09/2017  10:43 AM  Other:  09/09/2017  10:43 AM  Other: 09/09/2017  10:43 AM    Scribe for Treatment Team: Aram BeechamAngel M Leila Schuff, Student-Social Work 09/09/2017 10:43 AM
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
## 2017-09-09 NOTE — [facility] Group Notes (Signed)
LCSW Group Therapy 09/09/2017 1:15pm  Type of Therapy and Topic:  Group Therapy:  Change and Accountability  Participation Level:  None  Description of Group In this group, patients discussed power and accountability for change.  The group identified the challenges related to accountability and the difficulty of accepting the outcomes of negative behaviors.  Patients were encouraged to openly discuss a challenge/change they could take responsibility for.  Patients discussed the use of "change talk" and positive thinking as ways to support achievement of personal goals.  The group discussed ways to give support and empowerment to peers.  Therapeutic Goals: 1. Patients will state the relationship between personal power and accountability in the change process 2. Patients will identify the positive and negative consequences of a personal choice they have made 3. Patients will identify one challenge/choice they will take responsibility for making 4. Patients will discuss the role of "change talk" and the impact of positive thinking as it supports successful personal change 5. Patients will verbalize support and affirmation of change efforts in peers  Summary of Patient Progress:  Desiree LeatherwoodKatherine attempted to come to group, but stated that she was feeling triggered and return to her room.  She appeared to be shaking and was tearful.  She did not come back to the group.   Therapeutic Modalities Solution Focused Brief Therapy Motivational Interviewing Cognitive Behavioral Therapy  Carlynn Heraldngel M Rashea Hoskie, Student-Social Work 09/09/2017 12:55 PM

## 2017-09-10 LAB — HEMOGLOBIN A1C
Hgb A1c MFr Bld: 5 % (ref 4.8–5.6)
Mean Plasma Glucose: 96.8 mg/dL

## 2017-09-10 LAB — TSH: TSH: 2.217 u[IU]/mL (ref 0.350–4.500)

## 2017-09-10 MED ORDER — RISPERIDONE 1 MG PO TABS
1.0000 mg | ORAL_TABLET | Freq: Every day | ORAL | Status: DC
  Administered 2017-09-10 – 2017-09-12 (×3): 1 mg via ORAL
  Filled 2017-09-10 (×6): qty 1

## 2017-09-10 MED ORDER — HYDROXYZINE HCL 25 MG PO TABS
25.0000 mg | ORAL_TABLET | Freq: Four times a day (QID) | ORAL | Status: DC | PRN
  Administered 2017-09-11 – 2017-09-12 (×3): 25 mg via ORAL
  Filled 2017-09-10 (×3): qty 1

## 2017-09-10 NOTE — Progress Notes (Signed)
Nursing Progress Note: 7p-7a D: Pt currently presents with a anxious/childlike/pleasant affect and behavior. Pt states "I was having a rough time around meal time... You know the voices and everything, but after that it was a good day. My family came to visit me and that was a lot of fun." Interacting appropriately with the milieu. Pt reports good sleep during the previous night with current medication regimen.   A: Pt provided with medications per providers orders. Pt's labs and vitals were monitored throughout the night. Pt supported emotionally and encouraged to express concerns and questions. Pt educated on medications.  R: Pt's safety ensured with 15 minute and environmental checks. Pt currently denies SI, HI, and AVH. Pt verbally contracts to seek staff if SI,HI, or AVH occurs and to consult with staff before acting on any harmful thoughts. Will continue to monitor.

## 2017-09-10 NOTE — [facility] Group Notes (Addendum)
LCSW Group Therapy Note   09/10/2017 1:15pm   Type of Therapy and Topic:  Group Therapy:  Positive Affirmations   Participation Level:  Active  Description of Group: This group addressed positive affirmation toward self and others. Patients went around the room and identified two positive things about themselves and two positive things about a peer in the room. Patients reflected on how it felt to share something positive with others, to identify positive things about themselves, and to hear positive things from others. Patients were encouraged to have a daily reflection of positive characteristics or circumstances.  Therapeutic Goals 1. Patient will verbalize two of their positive qualities 2. Patient will demonstrate empathy for others by stating two positive qualities about a peer in the group 3. Patient will verbalize their feelings when voicing positive self affirmations and when voicing positive affirmations of others 4. Patients will discuss the potential positive impact on their wellness/recovery of focusing on positive traits of self and others. Summary of Patient Progress:  Desiree LeatherwoodKatherine attended group and was there the entire time. When she thinks of relapse she thinks about an abyss, that is a never ending dark hole. When she begins to feel distress or like she may relapse she will isolate herself and write music or poems to help calm and distract her.  She also plays guitar to help her avoid relapse.  Desiree Berger parents are a positive source for her. She showed no signs of psychosis and her mood was good.   Therapeutic Modalities Cognitive Behavioral Therapy Motivational Interviewing  Carlynn Heraldngel M Loyed Wilmes, Student-Social Work 09/10/2017 1:21 PM

## 2017-09-10 NOTE — Progress Notes (Signed)
[facility] MD Progress Note  09/10/2017 1:17 PM Desiree Berger  MRN:  [id number] Subjective:   Desiree Berger is a 21 y/o F with history of MDD with psychotic features who was admitted with worsening depression, AH, and illicit use of wellbutrin via insufflation. Pt was having bothersome AH and was started on risperdal upon arrival. Today upon interview, pt reports she is doing well overall. She reports that she feels "sleepy" but she has no other complaints. She denies SI/HI/AH/VH. She reports last instance of AH was yesterday during the afternoon, but the voices have quieted since taking the medication. She is sleeping well and her appetite is adequate. Discussed with patient that we could change atarax to PRN so that she is not sedated during the day and we could also change risperdal from BID dosing to qhs, and pt was in agreement. She had no further questions, comments, or concerns.  Principal Problem: MDD (major depressive disorder), recurrent, severe, with psychosis (HCC) Diagnosis:   Patient Active Problem List   Diagnosis Date Noted  . Substance induced mood disorder (HCC) [F19.94] 09/08/2017  . MDD (major depressive disorder), recurrent, severe, with psychosis (HCC) [F33.3] 09/08/2017  . MDD (major depressive disorder), single episode, severe (HCC) [F32.2] 01/16/2014  . GAD (generalized anxiety disorder) [F41.1] 01/16/2014  . Closed fracture of left toe [S92.912A] 04/14/2012   Total Time spent with patient: 30 minutes  Past Psychiatric History: see H&P  Past Medical History:  Past Medical History:  Diagnosis Date  . Asthma   . Seasonal allergies     Past Surgical History:  Procedure Laterality Date  . ADENOIDECTOMY    . TONSILLECTOMY     Family History:  Family History  Problem Relation Age of Onset  . Hypertension Mother   . Mental illness Mother   . Hyperlipidemia Father   . Birth defects Maternal Grandmother   . Heart attack Neg Hx   . Diabetes Neg Hx    . Sudden death Neg Hx    Family Psychiatric  History: see H&P Social History:  Social History   Substance and Sexual Activity  Alcohol Use No     Social History   Substance and Sexual Activity  Drug Use Yes  . Types: Cocaine   Comment: Stated cocaine ingestion x 2 hours.    Social History   Socioeconomic History  . Marital status: Single    Spouse name: None  . Number of children: None  . Years of education: None  . Highest education level: None  Social Needs  . Financial resource strain: None  . Food insecurity - worry: None  . Food insecurity - inability: None  . Transportation needs - medical: None  . Transportation needs - non-medical: None  Occupational History  . None  Tobacco Use  . Smoking status: Current Every Day Smoker  . Smokeless tobacco: Never Used  Substance and Sexual Activity  . Alcohol use: No  . Drug use: Yes    Types: Cocaine    Comment: Stated cocaine ingestion x 2 hours.  . Sexual activity: No    Birth control/protection: Abstinence  Other Topics Concern  . None  Social History Narrative  . None   Additional Social History:                         Sleep: Good  Appetite:  Good  Current Medications: Current Facility-Administered Medications  Medication Dose Route Frequency Provider Last Rate Last Dose  .  acetaminophen (TYLENOL) tablet 650 mg  650 mg Oral Q6H PRN Laveda AbbeParks, Laurie Britton, NP      . alum & mag hydroxide-simeth (MAALOX/MYLANTA) 200-200-20 MG/5ML suspension 30 mL  30 mL Oral Q4H PRN Laveda AbbeParks, Laurie Britton, NP      . citalopram (CELEXA) tablet 20 mg  20 mg Oral Daily Laveda AbbeParks, Laurie Britton, NP   20 mg at 09/10/17 0818  . hydrOXYzine (ATARAX/VISTARIL) tablet 25 mg  25 mg Oral Q6H PRN Micheal Likensainville, Randale Carvalho T, MD      . magnesium hydroxide (MILK OF MAGNESIA) suspension 30 mL  30 mL Oral Daily PRN Laveda AbbeParks, Laurie Britton, NP      . risperiDONE (RISPERDAL) tablet 1 mg  1 mg Oral QHS Micheal Likensainville, Grantley Savage T, MD      .  traZODone (DESYREL) tablet 50 mg  50 mg Oral QHS PRN Laveda AbbeParks, Laurie Britton, NP   50 mg at 09/09/17 2051    Lab Results:  Results for orders placed or performed during the hospital encounter of 09/08/17 (from the past 48 hour(s))  Lipid panel     Status: Abnormal   Collection Time: 09/09/17  6:37 PM  Result Value Ref Range   Cholesterol 145 0 - 200 mg/dL   Triglycerides 782122 <956<150 mg/dL   HDL 39 (L) >21>40 mg/dL   Total CHOL/HDL Ratio 3.7 RATIO   VLDL 24 0 - 40 mg/dL   LDL Cholesterol 82 0 - 99 mg/dL    Comment:        Total Cholesterol/HDL:CHD Risk Coronary Heart Disease Risk Table                     Men   Women  1/2 Average Risk   3.4   3.3  Average Risk       5.0   4.4  2 X Average Risk   9.6   7.1  3 X Average Risk  23.4   11.0        Use the calculated Patient Ratio above and the CHD Risk Table to determine the patient's CHD Risk.        ATP III CLASSIFICATION (LDL):  <100     mg/dL   Optimal  [id number]  mg/dL   Near or Above                    Optimal  130-159  mg/dL   Borderline  [id number]  mg/dL   High  >952>190     mg/dL   Very High Performed at [facility], [address] [place], [place] [address]   TSH     Status: None   Collection Time: 09/10/17  6:41 AM  Result Value Ref Range   TSH 2.217 0.350 - 4.500 uIU/mL    Comment: Performed by a 3rd Generation assay with a functional sensitivity of <=0.01 uIU/mL. Performed at [facility], [address] [place], [place] [address]   Hemoglobin A1c     Status: None   Collection Time: 09/10/17  6:41 AM  Result Value Ref Range   Hgb A1c MFr Bld 5.0 4.8 - 5.6 %    Comment: (NOTE) Pre diabetes:          5.7%-6.4% Diabetes:              >6.4% Glycemic control for   <7.0% adults with diabetes    Mean Plasma Glucose 96.8 mg/dL    Comment: Performed at [facility],  [address], [place], [place] [address]    Blood Alcohol level:  Lab Results  Component Value Date   ETH <10 09/07/2017     Metabolic Disorder Labs: Lab Results  Component Value Date   HGBA1C 5.0 09/10/2017   MPG 96.8 09/10/2017   Lab Results  Component Value Date   PROLACTIN 62.8 01/16/2014   Lab Results  Component Value Date   CHOL 145 09/09/2017   TRIG 122 09/09/2017   HDL 39 (L) 09/09/2017   CHOLHDL 3.7 09/09/2017   VLDL 24 09/09/2017   LDLCALC 82 09/09/2017   LDLCALC 81 01/16/2014    Physical Findings: AIMS: Facial and Oral Movements Muscles of Facial Expression: None, normal Lips and Perioral Area: None, normal Jaw: None, normal Tongue: None, normal,Extremity Movements Upper (arms, wrists, hands, fingers): None, normal Lower (legs, knees, ankles, toes): None, normal, Trunk Movements Neck, shoulders, hips: None, normal, Overall Severity Severity of abnormal movements (highest score from questions above): None, normal Incapacitation due to abnormal movements: None, normal Patient's awareness of abnormal movements (rate only patient's report): No Awareness, Dental Status Current problems with teeth and/or dentures?: No Does patient usually wear dentures?: No  CIWA:    COWS:     Musculoskeletal: Strength & Muscle Tone: within normal limits Gait & Station: normal Patient leans: N/A  Psychiatric Specialty Exam: Physical Exam  Nursing note and vitals reviewed.   Review of Systems  Constitutional: Negative for fever.  Respiratory: Negative for cough.   Gastrointestinal: Negative for abdominal pain, heartburn, nausea and vomiting.  Psychiatric/Behavioral: Negative for depression, hallucinations and suicidal ideas.    Blood pressure 127/73, pulse 82, temperature 98.6 F (37 C), temperature source Oral, resp. rate 16, height 5\' 5"  (1.651 m), weight 81.2 kg (179 lb), SpO2 100 %.Body mass index is 29.79 kg/m.  General Appearance: Casual and Fairly Groomed  Eye Contact:  Good  Speech:  Clear and Coherent and Normal Rate  Volume:  Normal  Mood:  Anxious  Affect:  Congruent and  Constricted  Thought Process:  Coherent and Goal Directed  Orientation:  Full (Time, Place, and Person)  Thought Content:  Logical  Suicidal Thoughts:  No  Homicidal Thoughts:  No  Memory:  Immediate;   Good Recent;   Good Remote;   Good  Judgement:  Fair  Insight:  Fair  Psychomotor Activity:  Normal  Concentration:  Concentration: Good  Recall:  Good  Fund of Knowledge:  Good  Language:  Good  Akathisia:  No  Handed:    AIMS (if indicated):     Assets:  Communication Skills Desire for Improvement Physical Health Resilience Social Support  ADL's:  Intact  Cognition:  WNL  Sleep:  Number of Hours: 6.75     Treatment Plan Summary: Daily contact with patient to assess and evaluate symptoms and progress in treatment and Medication management. Pt reports improvement of AH with use of risperdal, but she is drowsy during day. We will change Atarax to PRN and change Risperdal dosing to qhs. - Continue inpatient hospitalization - MDD with psychotic features             - Change risperdal 0.5mg  BID to risperdal 1mg  QHS             - Continue celexa 20mg  qDay    - Change atarax 25mg  TID to atarax 25mg  q6h prn anxiety - Encourage participation in groups and the therapeutic milieu - Discharge planning will be ongoing   Micheal Likens, MD 09/10/2017,  1:17 PM
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
## 2017-09-10 NOTE — Progress Notes (Signed)
Recreation Therapy Notes  Date: 09/10/17 Time: 0945 Location: [address] Dayroom  Group Topic: Self-Expression  Goal Area(s) Addresses:  Patient will successfully identify positive attributes about themselves.  Patient will successfully identify benefit of improved self-expression.   Intervention: None  Activity: Show Your Talent.  Patients were encouraged to share a talent they have with the group.  Patients could sing, dance, do poetry, draw, etc.  Education:  Self-Expression, Discharge Planning.   Education Outcome: Acknowledges education/In group clarification offered/Needs additional education  Clinical Observations/Feedback: Pt did not attend group.   Caroll RancherMarjette Jacklynn Dehaas, LRT/CTRS         Caroll RancherLindsay, Naythan Douthit A 09/10/2017 11:25 AM

## 2017-09-11 LAB — PROLACTIN: Prolactin: 108.7 ng/mL — ABNORMAL HIGH (ref 4.8–23.3)

## 2017-09-11 MED ORDER — SALINE SPRAY 0.65 % NA SOLN
1.0000 | NASAL | Status: DC | PRN
  Administered 2017-09-11: 1 via NASAL
  Filled 2017-09-11: qty 44

## 2017-09-11 NOTE — [facility] Group Notes (Signed)
[facility] Group Notes: (Clinical Social Work)   09/11/2017      Type of Therapy:  Group Therapy   Participation Level:  Did Not Attend despite MHT prompting   Ambrose MantleMareida Grossman-Orr, LCSW 09/11/2017, 11:56 AM

## 2017-09-11 NOTE — Progress Notes (Signed)
D: Pt awake in dayroom watching TV with peers at present. Pt slept majority of this morning "I'm so tired, I needed to rest". Pt Denies SI, HI, AVH and pain when assessed. Verbalized being anxious this afternoon related to milieu dynamics (yelling, disruptive peer in dayroom). Rates her depression 0/10, hopelessness 0/10 and anxiety 5/10 at this time. Remains guarded but forwards on interactions. Reports she's eating and sleeping well.  A: Scheduled and PRN (Vistaril-anxiety) medications given as ordered with verbal educations and effects monitored. Encouraged pt to attend unit groups and comply with medications as per treatment plan. Q 15 minutes safety checks maintained without self harm gestures or outburst. Reassurance provided to pt.  R: Pt did not attend group this shift as she was asleep then. Compliant with medications, denies adverse drug reactions. Tolerates all PO intake well. Remains safe on and off unit.

## 2017-09-11 NOTE — Plan of Care (Signed)
Pt safe on the unit at this time 

## 2017-09-11 NOTE — Progress Notes (Signed)
Nursing Progress Note: 7p-7a D: Pt currently presents with a tearful/childlike/anxious affect and behavior. Pt states "I want to remember how I got here. It's super spotty in what I can remember." Interacting appropriately with the milieu. Pt reports good sleep during the previous night with current medication regimen. Pt did attend wrap-up group.  A: Pt provided with medications per providers orders. Pt's labs and vitals were monitored throughout the night. Pt supported emotionally and encouraged to express concerns and questions. Pt educated on medications.  R: Pt's safety ensured with 15 minute and environmental checks. Pt currently denies SI, HI, and AVH. Pt verbally contracts to seek staff if SI,HI, or AVH occurs and to consult with staff before acting on any harmful thoughts. Will continue to monitor.

## 2017-09-11 NOTE — Progress Notes (Signed)
[facility]  09/11/2017 3:25 PM Desiree BrodKatherine Michelle Berger  MRN:  [id number]  Subjective: Desiree Berger reports, "I'm doing well. My mood is improving. I have been able to talk to both my parents. My dad visited me last evening. The visit went well".  Objective: Desiree Berger "Desiree" Earlene Berger is a 21 y/o F with history of MDD with psychotic features who was admitted with worsening depression, AH, and illicit use of wellbutrin via insufflation. Pt was having bothersome AH and was started on risperdal upon arrival. Today upon interview, pt reports she is doing well overall. She reports that she feels "sleepy" but she has no other complaints. She denies SI/HI/AH/VH. She reports last instance of AH was yesterday during the afternoon, but the voices have quieted since taking the medication. She is sleeping well and her appetite is adequate. Discussed with patient that we could change atarax to PRN so that she is not sedated during the day and we could also change risperdal from BID dosing to qhs, and pt was in agreement. She had no further questions, comments, or concerns. 09-11-17, Florentina AddisonKatie is seen, chart reviewed. She is lying down sleeping, easily aroused, verbally responsive. Is making good eye contact. She is participating in the group sessions. She is visible on the unit. Is taking & tolerating his medication regimen. Denies any adverse effects. She says she has been in contact with both her parents & her father actually visited her yesterday evening. She says it was a good visit. Staff continue to provide support.  Principal Problem: MDD (major depressive disorder), recurrent, severe, with psychosis (HCC)  Diagnosis:   Patient Active Problem List   Diagnosis Date Noted  . MDD (major depressive disorder), recurrent, severe, with psychosis (HCC) [F33.3] 09/08/2017    Priority: High  . GAD (generalized anxiety disorder) [F41.1] 01/16/2014    Priority: Medium  . Substance induced mood disorder (HCC)  [F19.94] 09/08/2017  . MDD (major depressive disorder), single episode, severe (HCC) [F32.2] 01/16/2014  . Closed fracture of left toe [S92.912A] 04/14/2012   Total Time spent with patient: 15 minutes  Past Psychiatric History: See H&P  Past Medical History:  Past Medical History:  Diagnosis Date  . Asthma   . Seasonal allergies     Past Surgical History:  Procedure Laterality Date  . ADENOIDECTOMY    . TONSILLECTOMY     Family History:  Family History  Problem Relation Age of Onset  . Hypertension Mother   . Mental illness Mother   . Hyperlipidemia Father   . Birth defects Maternal Grandmother   . Heart attack Neg Hx   . Diabetes Neg Hx   . Sudden death Neg Hx    Family Psychiatric  History: see H&P  Social History:  Social History   Substance and Sexual Activity  Alcohol Use No     Social History   Substance and Sexual Activity  Drug Use Yes  . Types: Cocaine   Comment: Stated cocaine ingestion x 2 hours.    Social History   Socioeconomic History  . Marital status: Single    Spouse name: None  . Number of children: None  . Years of education: None  . Highest education level: None  Social Needs  . Financial resource strain: None  . Food insecurity - worry: None  . Food insecurity - inability: None  . Transportation needs - medical: None  . Transportation needs - non-medical: None  Occupational History  . None  Tobacco Use  . Smoking status:  Current Every Day Smoker  . Smokeless tobacco: Never Used  Substance and Sexual Activity  . Alcohol use: No  . Drug use: Yes    Types: Cocaine    Comment: Stated cocaine ingestion x 2 hours.  . Sexual activity: No    Birth control/protection: Abstinence  Other Topics Concern  . None  Social History Narrative  . None   Additional Social History:   Sleep: Good  Appetite:  Good  Current Medications: Current Facility-Administered Medications  Medication Dose Route Frequency Provider Last Rate Last  Dose  . acetaminophen (TYLENOL) tablet 650 mg  650 mg Oral Q6H PRN Laveda AbbeParks, Laurie Britton, NP      . alum & mag hydroxide-simeth (MAALOX/MYLANTA) 200-200-20 MG/5ML suspension 30 mL  30 mL Oral Q4H PRN Laveda AbbeParks, Laurie Britton, NP      . citalopram (CELEXA) tablet 20 mg  20 mg Oral Daily Laveda AbbeParks, Laurie Britton, NP   20 mg at 09/11/17 1258  . hydrOXYzine (ATARAX/VISTARIL) tablet 25 mg  25 mg Oral Q6H PRN Micheal Likensainville, Christopher T, MD      . magnesium hydroxide (MILK OF MAGNESIA) suspension 30 mL  30 mL Oral Daily PRN Laveda AbbeParks, Laurie Britton, NP      . risperiDONE (RISPERDAL) tablet 1 mg  1 mg Oral QHS Micheal Likensainville, Christopher T, MD   1 mg at 09/10/17 2131  . traZODone (DESYREL) tablet 50 mg  50 mg Oral QHS PRN Laveda AbbeParks, Laurie Britton, NP   50 mg at 09/10/17 2131    Lab Results:  Results for orders placed or performed during the hospital encounter of 09/08/17 (from the past 48 hour(s))  Lipid panel     Status: Abnormal   Collection Time: 09/09/17  6:37 PM  Result Value Ref Range   Cholesterol 145 0 - 200 mg/dL   Triglycerides 960122 <454<150 mg/dL   HDL 39 (L) >09>40 mg/dL   Total CHOL/HDL Ratio 3.7 RATIO   VLDL 24 0 - 40 mg/dL   LDL Cholesterol 82 0 - 99 mg/dL    Comment:        Total Cholesterol/HDL:CHD Risk Coronary Heart Disease Risk Table                     Men   Women  1/2 Average Risk   3.4   3.3  Average Risk       5.0   4.4  2 X Average Risk   9.6   7.1  3 X Average Risk  23.4   11.0        Use the calculated Patient Ratio above and the CHD Risk Table to determine the patient's CHD Risk.        ATP III CLASSIFICATION (LDL):  <100     mg/dL   Optimal  [id number]  mg/dL   Near or Above                    Optimal  130-159  mg/dL   Borderline  [id number]  mg/dL   High  >213>190     mg/dL   Very High Performed at [facility], [address] [place], [place] [address]   TSH     Status: None   Collection Time: 09/10/17  6:41 AM  Result Value Ref Range   TSH 2.217 0.350 - 4.500 uIU/mL     Comment: Performed by a 3rd Generation assay with a functional sensitivity of <=0.01 uIU/mL. Performed at [facility],  [address] [place], [place] [address]   Prolactin     Status: Abnormal   Collection Time: 09/10/17  6:41 AM  Result Value Ref Range   Prolactin 108.7 (H) 4.8 - 23.3 ng/mL    Comment: (NOTE) Performed At: [facility] [address] [place], [place] [id number] Jolene Schimke MD [id number] Performed at [facility], [address] [place], [place] [address]   Hemoglobin A1c     Status: None   Collection Time: 09/10/17  6:41 AM  Result Value Ref Range   Hgb A1c MFr Bld 5.0 4.8 - 5.6 %    Comment: (NOTE) Pre diabetes:          5.7%-6.4% Diabetes:              >6.4% Glycemic control for   <7.0% adults with diabetes    Mean Plasma Glucose 96.8 mg/dL    Comment: Performed at [facility], [address] [place], [place] [address]   Blood Alcohol level:  Lab Results  Component Value Date   ETH <10 09/07/2017   Metabolic Disorder Labs: Lab Results  Component Value Date   HGBA1C 5.0 09/10/2017   MPG 96.8 09/10/2017   Lab Results  Component Value Date   PROLACTIN 108.7 (H) 09/10/2017   PROLACTIN 62.8 01/16/2014   Lab Results  Component Value Date   CHOL 145 09/09/2017   TRIG 122 09/09/2017   HDL 39 (L) 09/09/2017   CHOLHDL 3.7 09/09/2017   VLDL 24 09/09/2017   LDLCALC 82 09/09/2017   LDLCALC 81 01/16/2014   Physical Findings: AIMS: Facial and Oral Movements Muscles of Facial Expression: None, normal Lips and Perioral Area: None, normal Jaw: None, normal Tongue: None, normal,Extremity Movements Upper (arms, wrists, hands, fingers): None, normal Lower (legs, knees, ankles, toes): None, normal, Trunk Movements Neck, shoulders, hips: None, normal, Overall Severity Severity of abnormal movements (highest score from questions above): None, normal Incapacitation due to abnormal movements:  None, normal Patient's awareness of abnormal movements (rate only patient's report): No Awareness, Dental Status Current problems with teeth and/or dentures?: No Does patient usually wear dentures?: No  CIWA:    COWS:     Musculoskeletal: Strength & Muscle Tone: within normal limits Gait & Station: normal Patient leans: N/A  Psychiatric Specialty Exam: Physical Exam  Nursing note and vitals reviewed.   Review of Systems  Constitutional: Negative for fever.  Respiratory: Negative for cough.   Gastrointestinal: Negative for abdominal pain, heartburn, nausea and vomiting.  Psychiatric/Behavioral: Negative for depression, hallucinations and suicidal ideas.    Blood pressure 127/73, pulse 82, temperature 98.6 F (37 C), temperature source Oral, resp. rate 16, height 5\' 5"  (1.651 m), weight 81.2 kg (179 lb), SpO2 100 %.Body mass index is 29.79 kg/m.  General Appearance: Casual and Fairly Groomed  Eye Contact:  Good  Speech:  Clear and Coherent and Normal Rate  Volume:  Normal  Mood:  "Improving"  Affect:  Congruent and reactive  Thought Process:  Coherent and Goal Directed  Orientation:  Full (Time, Place, and Person)  Thought Content:  Logical  Suicidal Thoughts:  No  Homicidal Thoughts:  No  Memory:  Immediate;   Good Recent;   Good Remote;   Good  Judgement:  Fair  Insight:  Fair  Psychomotor Activity:  Normal  Concentration:  Concentration: Good  Recall:  Good  Fund of Knowledge:  Good  Language:  Good  Akathisia:  No  Handed:    AIMS (  if indicated):     Assets:  Communication Skills Desire for Improvement Physical Health Resilience Social Support  ADL's:  Intact  Cognition:  WNL  Sleep:  Number of Hours: 6.75   Treatment Plan Summary: Daily contact with patient to assess and evaluate symptoms and progress in treatment and Medication management. Pt reports improvement of AH with use of risperdal, but she is drowsy during day. We will change Atarax to PRN and  change Risperdal dosing to qhs. No changes noted this morning, was sleeping in bed this morning, was waken-up for this follow-up assessment.  - Continue inpatient hospitalization  Will continue today 09/11/2017 plan as below except where it is noted.  - MDD with psychotic features             -Continue risperdal 1mg  QHS             - Continue celexa 20mg  qDay    -Continue atarax 25 mg q6h prn anxiety - Encourage participation in groups and the therapeutic milieu - Discharge planning will be ongoing  Sanjuana Kava, NP, PMHNP, FNP-BC. 09/11/2017, 3:25 PMPatient ID: Desiree Berger, female   DOB: 1996/03/09, 21 y.o.   MRN: [id number]

## 2017-09-11 NOTE — Progress Notes (Signed)
Adult Psychoeducational Group Note  Date:  09/11/2017 Time:  9:43 PM  Group Topic/Focus:  Wrap-Up Group:   The focus of this group is to help patients review their daily goal of treatment and discuss progress on daily workbooks.  Participation Level:  Active  Participation Quality:  Appropriate  Affect:  Appropriate  Cognitive:  Appropriate  Insight: Good  Engagement in Group:  Engaged  Modes of Intervention:  Activity  Additional Comments:  Pt rated her day a 8. Goal is to cope with issues and stay strong.  Natasha MeadKiara M Zev Blue 09/11/2017, 9:43 PM

## 2017-09-12 MED ORDER — BISACODYL 10 MG RE SUPP
10.0000 mg | Freq: Once | RECTAL | Status: AC
  Administered 2017-09-12: 10 mg via RECTAL
  Filled 2017-09-12 (×2): qty 1

## 2017-09-12 NOTE — Progress Notes (Signed)
[facility] MD Progress Note  09/12/2017 12:51 PM Desiree Berger  MRN:  [id number]  Subjective: Desiree Berger. reports, "I'm doing great. My medicines are workings, no side effects. I'm attending group sessions. I have not had a bowel movement in 4 days. Milk of Magnesia not helping".  Objective: Desiree Berger is a 21 y/o F with history of MDD with psychotic features who was admitted with worsening depression, AH, and illicit use of wellbutrin via insufflation. Pt was having bothersome AH and was started on risperdal upon arrival. Today upon interview, pt reports she is doing well overall. She reports that she feels "sleepy" but she has no other complaints. She denies SI/HI/AH/VH. She reports last instance of AH was yesterday during the afternoon, but the voices have quieted since taking the medication. She is sleeping well and her appetite is adequate. Discussed with patient that we could change atarax to PRN so that she is not sedated during the day and we could also change risperdal from BID dosing to qhs, and pt was in agreement. She had no further questions, comments, or concerns. 09-12-17, Desiree Berger is seen, chart reviewed. She is lying down sleeping, easily aroused, verbally responsive. Is making good eye contact. She is participating in the group sessions. She is visible on the unit. Is taking & tolerating her medication regimen. Denies any adverse effects. She said yesterday that she has been in contact with both her parents & her father actually visited her 2 evening ago. She says it was a good visit. Staff continue to provide support. She is medicated today with Dulcolax tab 10 mg for complaints of constipation.  Principal Problem: MDD (major depressive disorder), recurrent, severe, with psychosis (HCC)  Diagnosis:   Patient Active Problem List   Diagnosis Date Noted  . MDD (major depressive disorder), recurrent, severe, with psychosis (HCC) [F33.3] 09/08/2017    Priority: High  . GAD  (generalized anxiety disorder) [F41.1] 01/16/2014    Priority: Medium  . Substance induced mood disorder (HCC) [F19.94] 09/08/2017  . MDD (major depressive disorder), single episode, severe (HCC) [F32.2] 01/16/2014  . Closed fracture of left toe [S92.912A] 04/14/2012   Total Time spent with patient: 15 minutes  Past Psychiatric History: See H&P  Past Medical History:  Past Medical History:  Diagnosis Date  . Asthma   . Seasonal allergies     Past Surgical History:  Procedure Laterality Date  . ADENOIDECTOMY    . TONSILLECTOMY     Family History:  Family History  Problem Relation Age of Onset  . Hypertension Mother   . Mental illness Mother   . Hyperlipidemia Father   . Birth defects Maternal Grandmother   . Heart attack Neg Hx   . Diabetes Neg Hx   . Sudden death Neg Hx    Family Psychiatric  History: see H&P  Social History:  Social History   Substance and Sexual Activity  Alcohol Use No     Social History   Substance and Sexual Activity  Drug Use Yes  . Types: Cocaine   Comment: Stated cocaine ingestion x 2 hours.    Social History   Socioeconomic History  . Marital status: Single    Spouse name: None  . Number of children: None  . Years of education: None  . Highest education level: None  Social Needs  . Financial resource strain: None  . Food insecurity - worry: None  . Food insecurity - inability: None  . Transportation needs - medical: None  .  Transportation needs - non-medical: None  Occupational History  . None  Tobacco Use  . Smoking status: Current Every Day Smoker  . Smokeless tobacco: Never Used  Substance and Sexual Activity  . Alcohol use: No  . Drug use: Yes    Types: Cocaine    Comment: Stated cocaine ingestion x 2 hours.  . Sexual activity: No    Birth control/protection: Abstinence  Other Topics Concern  . None  Social History Narrative  . None   Additional Social History:   Sleep: Good  Appetite:  Good  Current  Medications: Current Facility-Administered Medications  Medication Dose Route Frequency Provider Last Rate Last Dose  . acetaminophen (TYLENOL) tablet 650 mg  650 mg Oral Q6H PRN Laveda AbbeParks, Laurie Britton, NP      . alum & mag hydroxide-simeth (MAALOX/MYLANTA) 200-200-20 MG/5ML suspension 30 mL  30 mL Oral Q4H PRN Laveda AbbeParks, Laurie Britton, NP      . bisacodyl (DULCOLAX) suppository 10 mg  10 mg Rectal Once Armandina StammerNwoko, Agnes I, NP      . citalopram (CELEXA) tablet 20 mg  20 mg Oral Daily Laveda AbbeParks, Laurie Britton, NP   20 mg at 09/12/17 0811  . hydrOXYzine (ATARAX/VISTARIL) tablet 25 mg  25 mg Oral Q6H PRN Micheal Likensainville, Christopher T, MD   25 mg at 09/11/17 2107  . magnesium hydroxide (MILK OF MAGNESIA) suspension 30 mL  30 mL Oral Daily PRN Laveda AbbeParks, Laurie Britton, NP   30 mL at 09/11/17 1529  . risperiDONE (RISPERDAL) tablet 1 mg  1 mg Oral QHS Micheal Likens[place], Christopher T, MD   1 mg at 09/11/17 2107  . sodium chloride (OCEAN) 0.65 % nasal spray 1 spray  1 spray Each Nare PRN Nira ConnBerry, Jason A, NP   1 spray at 09/11/17 2308  . traZODone (DESYREL) tablet 50 mg  50 mg Oral QHS PRN Laveda AbbeParks, Laurie Britton, NP   50 mg at 09/11/17 2107   Lab Results:  No results found for this or any previous visit (from the past 48 hour(s)). Blood Alcohol level:  Lab Results  Component Value Date   ETH <10 09/07/2017   Metabolic Disorder Labs: Lab Results  Component Value Date   HGBA1C 5.0 09/10/2017   MPG 96.8 09/10/2017   Lab Results  Component Value Date   PROLACTIN 108.7 (H) 09/10/2017   PROLACTIN 62.8 01/16/2014   Lab Results  Component Value Date   CHOL 145 09/09/2017   TRIG 122 09/09/2017   HDL 39 (L) 09/09/2017   CHOLHDL 3.7 09/09/2017   VLDL 24 09/09/2017   LDLCALC 82 09/09/2017   LDLCALC 81 01/16/2014   Physical Findings: AIMS: Facial and Oral Movements Muscles of Facial Expression: None, normal Lips and Perioral Area: None, normal Jaw: None, normal Tongue: None, normal,Extremity Movements Upper (arms,  wrists, hands, fingers): None, normal Lower (legs, knees, ankles, toes): None, normal, Trunk Movements Neck, shoulders, hips: None, normal, Overall Severity Severity of abnormal movements (highest score from questions above): None, normal Incapacitation due to abnormal movements: None, normal Patient's awareness of abnormal movements (rate only patient's report): No Awareness, Dental Status Current problems with teeth and/or dentures?: No Does patient usually wear dentures?: No  CIWA:    COWS:     Musculoskeletal: Strength & Muscle Tone: within normal limits Gait & Station: normal Patient leans: N/A  Psychiatric Specialty Exam: Physical Exam  Nursing note and vitals reviewed.   Review of Systems  Constitutional: Negative for fever.  Respiratory: Negative for cough.   Gastrointestinal: Negative for  abdominal pain, heartburn, nausea and vomiting.  Psychiatric/Behavioral: Negative for depression, hallucinations and suicidal ideas.    Blood pressure (!) 142/84, pulse 82, temperature 98 F (36.7 C), temperature source Oral, resp. rate 18, height 5\' 5"  (1.651 m), weight 81.2 kg (179 lb), SpO2 100 %.Body mass index is 29.79 kg/m.  General Appearance: Casual and Fairly Groomed  Eye Contact:  Good  Speech:  Clear and Coherent and Normal Rate  Volume:  Normal  Mood:  "Improving"  Affect:  Congruent and reactive  Thought Process:  Coherent and Goal Directed  Orientation:  Full (Time, Place, and Person)  Thought Content:  Logical  Suicidal Thoughts:  No  Homicidal Thoughts:  No  Memory:  Immediate;   Good Recent;   Good Remote;   Good  Judgement:  Fair  Insight:  Fair  Psychomotor Activity:  Normal  Concentration:  Concentration: Good  Recall:  Good  Fund of Knowledge:  Good  Language:  Good  Akathisia:  No  Handed:    AIMS (if indicated):     Assets:  Communication Skills Desire for Improvement Physical Health Resilience Social Support  ADL's:  Intact  Cognition:  WNL   Sleep:  Number of Hours: 6.5   Treatment Plan Summary: Daily contact with patient to assess and evaluate symptoms and progress in treatment and Medication management. Patient reports improved mood. We will continue Atarax to PRN. No changes noted this morning, was sleeping in bed this morning, was waken-up for this follow-up assessment.  - Continue inpatient hospitalization  Will continue today 09/12/2017 plan as below except where it is noted.  - MDD with psychotic features             -Continue risperdal 1mg  QHS             - Continue celexa 20mg  qDay    -Continue atarax 25 mg q6h prn anxiety.  -Constipation               -Administered Dulcolax 10 mg once.  - Encourage participation in groups and the therapeutic milieu - Discharge planning will be ongoing  Sanjuana KavaNwoko, Agnes I, NP, PMHNP, FNP-BC. 09/12/2017, 12:51 PMPatient ID: Desiree Berger, female   DOB: 1996/10/25, 21 y.o.   MRN: [id number]

## 2017-09-12 NOTE — [facility] Group Notes (Signed)
[facility]  Date/Time:  09/12/2017  11:00AM-12:00PM  Type of Therapy and Topic:  Group Therapy:  Music and Mood  Participation Level:  Active   Description of Group: In this process group, members listened to a variety of genres of music and identified that different types of music evoke different responses.  Patients were encouraged to identify music that was soothing for them and music that was energizing for them.  Patients discussed how this knowledge can help with wellness and recovery in various ways including managing depression and anxiety as well as encouraging healthy sleep habits.    Therapeutic Goals: 1. Patients will explore the impact of different varieties of music on mood 2. Patients will verbalize the thoughts they have when listening to different types of music 3. Patients will identify music that is soothing to them as well as music that is energizing to them 4. Patients will discuss how to use this knowledge to assist in maintaining wellness and recovery 5. Patients will explore the use of music as a coping skill  Summary of Patient Progress:  At the beginning of group, patient expressed that she felt wonderful today.  She participated fully throughout group, sang and danced quite a bit.  She became tearful with several songs.  She was given positive feedback throughout.  At the end of group she stated she felt empowered.  Therapeutic Modalities: Solution Focused Brief Therapy Motivational Interviewing Activity   Desiree MantleMareida Grossman-Orr, LCSW 09/12/2017 8:21 AM

## 2017-09-12 NOTE — Progress Notes (Signed)
Data. Patient denies SI/HI/AVH. Verbally contracts for safety on the unit and to come to staff before acting of any self harm thoughts/feelings/voices.  Patient interacting minimally with patients and staff. She has spent most of the day in her room, only coming out for specific tasks. Patient is limited in her cognition. She appears preoccupied and to not be understanding some information during conversation and when educated. She takes a significant period of time to process questions and respond with an appropriate answer. When patient was told she had a lovely singing voice, patient started to sob. On her self assessment patient reports 0/10 for anxiety, depression and hopelessness. Her goal for today is, "...practice my coping skills. Hopefully I will have them mastered by Monday." patient reported significant constipation at the beginning of shift. NP notified and order received. Medication was successful in the afternoon. Action. Emotional support and encouragement offered. Education provided on medication, indications and side effect. Q 15 minute checks done for safety. Response. Safety on the unit maintained through 15 minute checks.  Medications taken as prescribed. Attended groups. Remained  appropriate through out shift.

## 2017-09-13 MED ORDER — CITALOPRAM HYDROBROMIDE 20 MG PO TABS: 20.0000 mg | ORAL_TABLET | Freq: Every day | ORAL | 0 refills | Status: DC

## 2017-09-13 MED ORDER — RISPERIDONE 1 MG PO TABS: 1.0000 mg | ORAL_TABLET | Freq: Every day | ORAL | 0 refills | Status: DC

## 2017-09-13 MED ORDER — PROPRANOLOL HCL 10 MG PO TABS: 10.0000 mg | ORAL_TABLET | Freq: Three times a day (TID) | ORAL | 0 refills | Status: DC

## 2017-09-13 MED ORDER — PROPRANOLOL HCL 10 MG PO TABS
10.0000 mg | ORAL_TABLET | Freq: Three times a day (TID) | ORAL | Status: DC
  Filled 2017-09-13 (×6): qty 1

## 2017-09-13 MED ORDER — PROPRANOLOL HCL 10 MG PO TABS
ORAL_TABLET | ORAL | Status: AC
  Filled 2017-09-13: qty 1

## 2017-09-13 MED ORDER — HYDROXYZINE HCL 25 MG PO TABS: 25.0000 mg | ORAL_TABLET | Freq: Four times a day (QID) | ORAL | 0 refills | Status: DC | PRN

## 2017-09-13 MED ORDER — TRAZODONE HCL 50 MG PO TABS: 50.0000 mg | ORAL_TABLET | Freq: Every evening | ORAL | 0 refills | Status: DC | PRN

## 2017-09-13 NOTE — Progress Notes (Signed)
Patient verbalizes readiness for discharge. Follow up plan explained, AVS, transition record and SRA given along with prescriptions. All belongings returned. Patient verbalizes understanding. Denies SI/HI and assures this Clinical research associatewriter she will seek assistance should that change. Patient discharged ambulatory and in stable condition to her grandmother.

## 2017-09-13 NOTE — Progress Notes (Signed)
D   Pt mom visited and asked to speak to this writer   Her mother expressed desire for patient to have a CT scan because she banged her head prior to coming to the hospital and she said the patient said she saw spots and felt dizzy at times and the mother said she wasn't acting like herself    Pt is pleasant on approach and cooperative   She takes her medication and interacts appropriately with others   She voiced no complaints to this caregiver A   Verbal support given   Medications administered and effectiveness monitored  Q 15 min checks R  Pt is safe at present time and receptive to verbal support

## 2017-09-13 NOTE — Progress Notes (Signed)
Adult Psychoeducational Group Note  Date:  09/13/2017 Time:  1:00 AM  Group Topic/Focus:  Wrap-Up Group:   The focus of this group is to help patients review their daily goal of treatment and discuss progress on daily workbooks.  Participation Level:  Active  Participation Quality:  Appropriate and Sharing  Affect:  Appropriate and Excited  Cognitive:  Appropriate  Insight: Appropriate and Good  Engagement in Group:  Engaged  Modes of Intervention:  Discussion  Additional Comments: Pt stated her goal for today was to control her emotions when she is in a group setting. Pt stated she accomplished her goal today. Pt rated her over all day a 8 out of 10. Pt stated she enjoyed all the groups held today. Pt stated the visited by her mother and family help improve her day as well.   Felipa FurnaceChristopher  Navdeep Fessenden 09/13/2017, 1:00 AM

## 2017-09-13 NOTE — Tx Team (Signed)
Interdisciplinary Treatment and Diagnostic Plan Update  09/13/2017 Time of Session: 9:06 AM  Desiree Berger MRN: [id number]  Principal Diagnosis: Major depressive disorder, recurrent with psychosis, Generalized anxiety disorder, substance induced mood disorder, polysubstance use  Secondary Diagnoses: Principal Problem:   MDD (major depressive disorder), recurrent, severe, with psychosis ([place])   Current Medications:  Current Facility-Administered Medications  Medication Dose Route Frequency Provider Last Rate Last Dose  . acetaminophen (TYLENOL) tablet 650 mg  650 mg Oral Q6H PRN Ethelene Hal, NP      . alum & mag hydroxide-simeth (MAALOX/MYLANTA) 200-200-20 MG/5ML suspension 30 mL  30 mL Oral Q4H PRN Ethelene Hal, NP      . citalopram (CELEXA) tablet 20 mg  20 mg Oral Daily Ethelene Hal, NP   20 mg at 09/13/17 0747  . hydrOXYzine (ATARAX/VISTARIL) tablet 25 mg  25 mg Oral Q6H PRN Pennelope Bracken, MD   25 mg at 09/12/17 2138  . magnesium hydroxide (MILK OF MAGNESIA) suspension 30 mL  30 mL Oral Daily PRN Ethelene Hal, NP   30 mL at 09/11/17 1529  . risperiDONE (RISPERDAL) tablet 1 mg  1 mg Oral QHS Pennelope Bracken, MD   1 mg at 09/12/17 2138  . sodium chloride (OCEAN) 0.65 % nasal spray 1 spray  1 spray Each Nare PRN Lindon Romp A, NP   1 spray at 09/11/17 2308  . traZODone (DESYREL) tablet 50 mg  50 mg Oral QHS PRN Ethelene Hal, NP   50 mg at 09/12/17 2138    PTA Medications: Medications Prior to Admission  Medication Sig Dispense Refill Last Dose  . buPROPion (WELLBUTRIN XL) 150 MG 24 hr tablet Take 150 mg daily by mouth.  0 09/07/2017 at Unknown time  . etonogestrel (NEXPLANON) 68 MG IMPL implant 1 each once by Subdermal route.   continuous  . FLUoxetine (PROZAC) 20 MG capsule   0 09/07/2017 at Unknown time  . mirtazapine (REMERON) 15 MG tablet Take 1 tablet (15 mg total) by mouth at bedtime. (Patient not taking:  Reported on 09/07/2017) 30 tablet 1 Completed Course at Unknown time    Treatment Modalities: Medication Management, Group therapy, Case management,  1 to 1 session with clinician, Psychoeducation, Recreational therapy.  Patient Stressors: Legal issue Substance abuse Traumatic event  Patient Strengths: Curator fund of knowledge Physical Health   Physician Treatment Plan for Primary Diagnosis: Major depressive disorder, recurrent with psychosis, Generalized anxiety disorder, substance induced mood disorder, polysubstance use Long Term Goal(s): Improvement in symptoms so as ready for discharge  Short Term Goals: Ability to identify changes in lifestyle to reduce recurrence of condition will improve Ability to demonstrate self-control will improve Ability to identify and develop effective coping behaviors will improve Compliance with prescribed medications will improve Ability to identify triggers associated with substance abuse/mental health issues will improve  Medication Management: Evaluate patient's response, side effects, and tolerance of medication regimen.  Therapeutic Interventions: 1 to 1 sessions, Unit Group sessions and Medication administration.  Evaluation of Outcomes: Adequate for Discharge  Physician Treatment Plan for Secondary Diagnosis: Principal Problem:   MDD (major depressive disorder), recurrent, severe, with psychosis ([place])  Long Term Goal(s): Improvement in symptoms so as ready for discharge  Short Term Goals: Ability to identify changes in lifestyle to reduce recurrence of condition will improve Ability to demonstrate self-control will improve Ability to identify and develop effective coping behaviors will improve Compliance with prescribed medications will improve Ability to  identify triggers associated with substance abuse/mental health issues will improve  Medication Management: Evaluate patient's response, side effects, and  tolerance of medication regimen.  Therapeutic Interventions: 1 to 1 sessions, Unit Group sessions and Medication administration.  Evaluation of Outcomes: Adequate for Discharge   RN Treatment Plan for Primary Diagnosis: Major depressive disorder, recurrent with psychosis, Generalized anxiety disorder, substance induced mood disorder, polysubstance use Long Term Goal(s): Knowledge of disease and therapeutic regimen to maintain health will improve  Short Term Goals: Ability to remain free from injury will improve, Ability to disclose and discuss suicidal ideas, Ability to identify and develop effective coping behaviors will improve and Compliance with prescribed medications will improve  Medication Management: RN will administer medications as ordered by provider, will assess and evaluate patient's response and provide education to patient for prescribed medication. RN will report any adverse and/or side effects to prescribing provider.  Therapeutic Interventions: 1 on 1 counseling sessions, Psychoeducation, Medication administration, Evaluate responses to treatment, Monitor vital signs and CBGs as ordered, Perform/monitor CIWA, COWS, AIMS and Fall Risk screenings as ordered, Perform wound care treatments as ordered.  Evaluation of Outcomes: Adequate for Discharge   LCSW Treatment Plan for Primary Diagnosis: Major depressive disorder, recurrent with psychosis, Generalized anxiety disorder, substance induced mood disorder, polysubstance use Long Term Goal(s): Safe transition to appropriate next level of care at discharge, Engage patient in therapeutic group addressing interpersonal concerns.  Short Term Goals: Engage patient in aftercare planning with referrals and resources, Facilitate acceptance of mental health diagnosis and concerns, Facilitate patient progression through stages of change regarding substance use diagnoses and concerns, Identify triggers associated with mental health/substance  abuse issues and Increase skills for wellness and recovery  Therapeutic Interventions: Assess for all discharge needs, 1 to 1 time with Social worker, Explore available resources and support systems, Assess for adequacy in community support network, Educate family and significant other(s) on suicide prevention, Complete Psychosocial Assessment, Interpersonal group therapy.  Evaluation of Outcomes: Met   Progress in Treatment: Attending groups: Yes Participating in groups: Yes Taking medication as prescribed: Yes Toleration of medication: Yes, no side effects reported at this time Family/Significant other contact made: Yes, Avila Albritton [phone] Patient understands diagnosis: Yes AEB asking for help with substance use treatment  Discussing patient identified problems/goals with staff: Yes Medical problems stabilized or resolved: Yes Denies suicidal/homicidal ideation: Yes Issues/concerns per patient self-inventory: None Other: N/A  New problem(s) identified: None identified at this time.   New Short Term/Long Term Goal(s):  Pt states "I would like help with my addiction, but I don't want to go to a rehab.  I would like to stay at home and do it".   Discharge Plan or Barriers: Upon discharge the pt will return home with her mother   Reason for Continuation of Hospitalization: Anxiety Depression Hallucinations Medication stabilization Suicidal ideation   Estimated Length of Stay: 09/14/17; will DC today 09/13/17  Attendees: Patient: Desiree Berger  09/13/2017  9:06 AM  Physician: Maris Berger, MD 09/13/2017  9:06 AM  Nursing: Pauline Aus, RN 09/13/2017  9:06 AM  RN Care Manager: Lars Pinks, RN 09/13/2017  9:06 AM  Social Worker: Eusebio Me LCSW 09/13/2017  9:06 AM  Recreational Therapist: Victorino Sparrow, LRT 09/13/2017  9:06 AM  Other: Norberto Sorenson, [place] 09/13/2017  9:06 AM  Other:  09/13/2017  9:06 AM  Other: 09/13/2017  9:06 AM    Scribe for  Treatment Team: Beverely Pace, LCSW 09/13/2017 9:06 AM

## 2017-09-13 NOTE — Progress Notes (Signed)
D: Patient observed up and visible in the milieu. Patient states she is doing fine and is hopeful for discharge. Patient's affect somewhat superficial, mood slightly anxious but pleasant. Per self inventory and discussions with writer, rates depression, hopelessness and anxiety all at a 0/10. Rates sleep as good, appetite as good, energy as normal and concentration as good.  States goal for today is to "today is my day to get out of here, talk to my psychiatrist about being discharged." Denies pain, physical problems.   A: Medicated per orders, no prns requested or required. Level III obs in place for safety. Emotional support offered and self inventory reviewed. Encouraged completion of Suicide Safety Plan and programming participation. Discussed POC with MD, SW.  R: Patient verbalizes understanding of POC. Patient denies SI/HI/AVH and remains safe on level III obs. Will continue to monitor closely and make verbal contact frequently. Identified during AM huddle as a discharge for today.

## 2017-09-13 NOTE — Progress Notes (Signed)
Recreation Therapy Notes  Date: 09/13/17 Time: 1000 Location: 500 Hall Dayroom  Group Topic: Coping Skills  Goal Area(s) Addresses:  Patients will be able to identify positive coping skills. Patients will be able to identify the benefits of using coping skills post d/c.  Behavioral Response: Engaged  Intervention: Worksheet   Activity: Mind map.  LRT and patients filled out the first eight boxes together with friends, anger, anxiety, depression, sadness, racing thoughts, grief and communicate.  Patients were to then come up with three coping skills for each of the situations identified.   Education: PharmacologistCoping Skills, Building control surveyorDischarge Planning.   Education Outcome: Acknowledges understanding/In group clarification offered/Needs additional education.   Clinical Observations/Feedback: Pt stated coping skills are used to help you deal with things".  Pt was pleasant and engaged during group.  Pt identified coping skills as keep an agenda for racing thoughts; step away for anger; watch a funny show for sadness; volunteer for grief; Youtube for depression and deep breathing/counting for anxiety.    Caroll RancherMarjette Benedetta Sundstrom, LRT/CTRS        Caroll RancherLindsay, Emmert Roethler A 09/13/2017 11:19 AM

## 2017-09-13 NOTE — Progress Notes (Signed)
Recreation Therapy Notes  INPATIENT RECREATION TR PLAN  Patient Details Name: Desiree Berger MRN: [id number] DOB: 08-09-1996 Today's Date: 09/13/2017  Rec Therapy Plan Is patient appropriate for Therapeutic Recreation?: Yes Treatment times per week: about 3 days Estimated Length of Stay: 5-7 days TR Treatment/Interventions: Group participation (Comment)  Discharge Criteria Pt will be discharged from therapy if:: Discharged Treatment plan/goals/alternatives discussed and agreed upon by:: Patient/family  Discharge Summary Short term goals set: Pt will demonstrate improved self esteem by naming 5 qualities about herself. Short term goals met: Adequate for discharge Progress toward goals comments: Groups attended Which groups?: Communication, Coping skills Reason goals not met: None Therapeutic equipment acquired: N/A Reason patient discharged from therapy: Discharge from hospital Pt/family agrees with progress & goals achieved: Yes Date patient discharged from therapy: 09/13/17   Victorino Sparrow, LRT/CTRS  Ria Comment, Bryndon Cumbie A 09/13/2017, 1:05 PM

## 2017-09-13 NOTE — [facility] Suicide Risk Assessment (Signed)
[facility]   Principal Problem: MDD (major depressive disorder), recurrent, severe, with psychosis ([facility]) Discharge Diagnoses:  Patient Active Problem List   Diagnosis Date Noted  . Substance induced mood disorder ([facility]) [F19.94] 09/08/2017  . MDD (major depressive disorder), recurrent, severe, with psychosis ([facility]) [F33.3] 09/08/2017  . MDD (major depressive disorder), single episode, severe ([facility]) [F32.2] 01/16/2014  . GAD (generalized anxiety disorder) [F41.1] 01/16/2014  . Closed fracture of left toe [S92.912A] 04/14/2012    Total Time spent with patient: 30 minutes  Musculoskeletal: Strength & Muscle Tone: within normal limits Gait & Station: normal Patient leans: N/A  Psychiatric Specialty Exam: Review of Systems  Constitutional: Negative for chills and fever.  Respiratory: Negative for cough.   Gastrointestinal: Negative for heartburn and nausea.  Psychiatric/Behavioral: Negative for depression, hallucinations and suicidal ideas.    Blood pressure (!) 142/84, pulse 82, temperature 98 F (36.7 C), temperature source Oral, resp. rate 18, height 5\' 5"  (1.651 m), weight 81.2 kg (179 lb), SpO2 100 %.Body mass index is 29.79 kg/m.  General Appearance: Casual and Fairly Groomed  Patent attorneyye Contact::  Good  Speech:  Clear and Coherent and Normal Rate  Volume:  Normal  Mood:  Euthymic  Affect:  Appropriate and Congruent  Thought Process:  Coherent and Goal Directed  Orientation:  Full (Time, Place, and Person)  Thought Content:  Logical  Suicidal Thoughts:  No  Homicidal Thoughts:  No  Memory:  Immediate;   Good Recent;   Good Remote;   Good  Judgement:  Fair  Insight:  Fair  Psychomotor Activity:  Normal  Concentration:  Good  Recall:  Good  Fund of Knowledge:Good  Language: Good  Akathisia:  No  Handed:    AIMS (if indicated):     Assets:  Communication Skills Desire for Improvement Financial Resources/Insurance Leisure Time Physical  Health Resilience Social Support  Sleep:  Number of Hours: 5.75  Cognition: WNL  ADL's:  Intact   Mental Status Per Nursing Assessment::   On Admission:  NA  Demographic Factors:  Adolescent or young adult, Caucasian and Low socioeconomic status  Loss Factors: Financial problems/change in socioeconomic status  Historical Factors: Family history of mental illness or substance abuse  Risk Reduction Factors:   Positive social support, Positive therapeutic relationship and Positive coping skills or problem solving skills  Continued Clinical Symptoms:  Depression:   Comorbid alcohol abuse/dependence Alcohol/Substance Abuse/Dependencies  Cognitive Features That Contribute To Risk:  None    Suicide Risk:  Minimal: No identifiable suicidal ideation.  Patients presenting with no risk factors but with morbid ruminations; may be classified as minimal risk based on the severity of the depressive symptoms  Follow-up Information    [facility] Follow up on 09/14/2017.   Specialty:  Behavioral Health Why:  Follow up appointment with [facility] on 09/14/17 at 1pm.  Please be there at 12:45 for check-in.  Appointments will be held 4 times a week, for 8 weeks.   Contact information: [address] [place] [address] [phone]        [facility] Follow up.   Specialty:  Behavioral Health Why:  Patient accepted Transitional Care Team services, case management will begin on day of discharge.   Contact informationElpidio Eric: [address] [place] [address] [phone]          Subjective data: Desiree LeatherwoodKatherine "[name]" Earlene Berger is a 21 y/o F who was admitted with worsening depression, symptoms of psychosis, and illicit use of prescribed  wellbutrin. Pt was started on risperdal to address symptoms of psychosis, and she had improvement of her symptoms. Her mood symptoms have also improved during her admission. Today upon evaluation, pt reports she is doing well overall. She is  sleeping well and her appetite is good. She denies SI/HI/AH/VH. She is tolerating her medications without difficulty. She notes some return of symptoms of tremor which she associates with illicit use of wellbutrin and we discussed attempting a trial of propranolol and pt was in agreement. Pt was able to engage in safety planning including [facility] if she feels unable to maintain her safety outside the hospital. Pt is future oriented about her outpatient follow up. She had no further questions, comments, or concerns.   Plan Of Care/Follow-up recommendations:  - Discharge to outpatient level of care - MDD with psychotic features -Continue risperdal 1mg  QHS - Continue celexa 20mg  qDay                         -Continue atarax 25 mg q6h prn anxiety.  - Start propranolol 10mg  TID  Activity:  as tolerated Diet:  normal Tests:  NA Other:  see above for DC plan  Micheal Likenshristopher T Lateshia Schmoker, MD 09/13/2017, 9:42 AM

## 2017-09-13 NOTE — Progress Notes (Signed)
  [facility] :  Will you be returning to the same living situation after discharge:  No. will return home w mother At discharge, do you have transportation home?: Yes,  family or can be provided w bus pass Do you have the ability to pay for your medications: Yes,  no concerns expressed  Release of information consent forms completed and in the chart;  Patient's signature needed at discharge.  Patient to Follow up at: Follow-up Information    [facility] Follow up on 09/14/2017.   Specialty:  Behavioral Health Why:  Follow up appointment with [facility] on 09/14/17 at 1pm.  Please be there at 12:45 for check-in.  Appointments will be held 4 times a week, for 8 weeks.   Contact information: [address] [place] [address] [phone]        [facility] Follow up.   Specialty:  Behavioral Health Why:  Patient accepted Transitional Care Team services, case management will begin on day of discharge.   Contact information: [address] [place] [address] [phone]           Next level of care provider [facility]:no  Safety Planning and Suicide Prevention discussed: Yes,  w family  Have you used any form of tobacco in the last 30 days? (Cigarettes, Smokeless Tobacco, Cigars, and/or Pipes): Yes  Has patient been referred to the Quitline?: Patient refused referral  Patient has been referred for addiction treatment: Yes  Referred for outpatient treatment  Sallee Langenne C Khalis Hittle, LCSW 09/13/2017, 9:07 AM
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
## 2017-09-13 NOTE — Plan of Care (Signed)
Pt showed improved self esteem after coping skills and communication recreation therapy sessions.   Caroll RancherMarjette [employer], LRT/CTRS

## 2018-08-08 ENCOUNTER — Ambulatory Visit (HOSPITAL_COMMUNITY)
Admission: EM | Admit: 2018-08-08 | Discharge: 2018-08-08 | Disposition: A | Discharge status: Home / Self Care | Payer: Medicaid Other | Attending: Family Medicine | Admitting: Family Medicine

## 2018-08-08 ENCOUNTER — Encounter (HOSPITAL_COMMUNITY): Payer: Self-pay | Admitting: Emergency Medicine

## 2018-08-08 DIAGNOSIS — J4521 Mild intermittent asthma with (acute) exacerbation: Secondary | ICD-10-CM | POA: Diagnosis not present

## 2018-08-08 DIAGNOSIS — J069 Acute upper respiratory infection, unspecified: Secondary | ICD-10-CM

## 2018-08-08 MED ORDER — IPRATROPIUM-ALBUTEROL 0.5-2.5 (3) MG/3ML IN SOLN
3.0000 mL | Freq: Once | RESPIRATORY_TRACT | Status: AC
  Administered 2018-08-08: 3 mL via RESPIRATORY_TRACT

## 2018-08-08 MED ORDER — PREDNISONE 20 MG PO TABS
ORAL_TABLET | ORAL | Status: AC
  Filled 2018-08-08: qty 1

## 2018-08-08 MED ORDER — PREDNISONE 20 MG PO TABS
20.0000 mg | ORAL_TABLET | Freq: Once | ORAL | Status: AC
  Administered 2018-08-08: 20 mg via ORAL

## 2018-08-08 MED ORDER — PREDNISONE 20 MG PO TABS: 20.0000 mg | ORAL_TABLET | Freq: Two times a day (BID) | ORAL | 0 refills | Status: DC

## 2018-08-08 MED ORDER — IPRATROPIUM-ALBUTEROL 0.5-2.5 (3) MG/3ML IN SOLN
RESPIRATORY_TRACT | Status: AC
  Filled 2018-08-08: qty 3

## 2018-08-08 MED ORDER — ALBUTEROL SULFATE HFA 108 (90 BASE) MCG/ACT IN AERS: 1.0000 | INHALATION_SPRAY | Freq: Four times a day (QID) | RESPIRATORY_TRACT | 0 refills | Status: DC | PRN

## 2018-08-08 NOTE — ED Provider Notes (Signed)
[facility]    CSN: [id number] Arrival date & time: 08/08/18  1015     History   Chief Complaint Chief Complaint  Patient presents with  . URI  . Shortness of Breath    HPI Desiree Berger is a 22 y.o. female.   HPI  She has had an upper respiratory infection for 7 days.  She woke up this morning with shortness of breath, wheezing, and painful breathing.  She had asthma as an infant but has not had any since a young child.  She does have multiple environmental allergies.  She is been taking over-the-counter medicines such as Alka-Seltzer and NyQuil for her symptoms.  They do not feel like they have helped.  She has had a low-grade temperature.  She did not take her temperature.  She does have postnasal drip and sinus drainage as well as the cough and shortness of breath.  She has chest pain with coughing.  She is here with her mother for evaluation.  Past Medical History:  Diagnosis Date  . Asthma   . Seasonal allergies     Patient Active Problem List   Diagnosis Date Noted  . Substance induced mood disorder (HCC) 09/08/2017  . 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 MDD (major depressive disorder), single episode, severe (HCC) 01/16/2014  . GAD (generalized anxiety disorder) 01/16/2014  . Closed fracture of left toe 04/14/2012    Past Surgical History:  Procedure Laterality Date  . ADENOIDECTOMY    . TONSILLECTOMY      OB History   None      Home Medications    Prior to Admission medications   Medication Sig Start Date End Date Taking? Authorizing Provider  etonogestrel (NEXPLANON) 68 MG IMPL implant 1 each once by Subdermal route.   Yes [provider]  albuterol (PROVENTIL HFA;VENTOLIN HFA) 108 (90 Base) MCG/ACT inhaler Inhale 1-2 puffs into the lungs every 6 (six) hours as needed for wheezing or shortness of breath. 08/08/18   Eustace Moore, MD  predniSONE (DELTASONE) 20 MG  tablet Take 1 tablet (20 mg total) by mouth 2 (two) times daily with a meal. 08/08/18   Eustace Moore, MD    Family History Family History  Problem Relation Age of Onset  . Hypertension Mother   . Mental illness Mother   . Hyperlipidemia Father   . Birth defects Maternal Grandmother   . Heart attack Neg Hx   . Diabetes Neg Hx   . Sudden death Neg Hx     Social History Social History   Tobacco Use  . Smoking status: Current Every Day Smoker  . Smokeless tobacco: Never Used  Substance Use Topics  . Alcohol use: No  . Drug use: Yes    Types: Cocaine    Comment: Stated cocaine ingestion x 2 hours.     Allergies   Patient has no known allergies.   Review of Systems Review of Systems  Constitutional: Positive for fever. Negative for chills and diaphoresis.  HENT: Positive for dental problem, postnasal drip and sinus pressure. Negative for ear pain, sore throat and trouble swallowing.   Eyes: Negative for photophobia, pain and visual disturbance.  Respiratory: Positive for cough, shortness of breath and wheezing.   Cardiovascular: Positive for chest pain. Negative for palpitations.  Gastrointestinal: Negative for abdominal pain, nausea and vomiting.  Genitourinary: Negative for dysuria and hematuria.  Musculoskeletal: Negative for arthralgias and back pain.  Skin: Negative  for color change and rash.  Neurological: Negative for seizures and syncope.  Psychiatric/Behavioral: Negative for decreased concentration and dysphoric mood.  All other systems reviewed and are negative.    Physical Exam Triage Vital Signs ED Triage Vitals  Enc Vitals Group     BP 08/08/18 1058 130/90     Pulse Rate 08/08/18 1058 77     Resp 08/08/18 1058 18     Temp 08/08/18 1058 98.7 F (37.1 C)     Temp Source 08/08/18 1058 Oral     SpO2 08/08/18 1058 98 %     Weight --      Height --      Head Circumference --      Peak Flow --      Pain Score 08/08/18 1100 6     Pain Loc --       Pain Edu? --      Excl. in GC? --    No data found.  Updated Vital Signs BP 130/90   Pulse 77   Temp 98.7 F (37.1 C) (Oral)   Resp 18   SpO2 98%      Physical Exam  Constitutional: She appears well-developed and well-nourished. She does not appear ill. No distress.  HENT:  Head: Normocephalic and atraumatic.  Mouth/Throat: Oropharynx is clear and moist. No posterior oropharyngeal edema.  Cobblestone appearance posterior pharynx with mild erythema  Eyes: Pupils are equal, round, and reactive to light. Conjunctivae are normal.  Neck: Normal range of motion.  Cardiovascular: Normal rate, regular rhythm and normal heart sounds.  Pulmonary/Chest: No tachypnea. No respiratory distress. She has wheezes. She exhibits no tenderness.  Patient has wheezes and rhonchi throughout both lung fields with inspiration.  Nebulized treatment is given.  Abdominal: Soft. Bowel sounds are normal. She exhibits no distension.  Musculoskeletal: Normal range of motion. She exhibits no edema.       Right lower leg: She exhibits no edema.       Left lower leg: She exhibits no edema.  Lymphadenopathy:    She has no cervical adenopathy.  Neurological: She is alert.  Skin: Skin is warm and dry.  Psychiatric: She has a normal mood and affect. Her behavior is normal.     UC Treatments / Results  Labs (all labs ordered are listed, but only abnormal results are displayed) Labs Reviewed - No data to display  EKG None  Radiology No results found.  Procedures Procedures after nebulizer treatment the lungs are auscultated a second time.  There is few wheezes with some coarse rhonchi.  Patient reports productive sputum.  Medications Ordered in UC Medications  ipratropium-albuterol (DUONEB) 0.5-2.5 (3) MG/3ML nebulizer solution 3 mL (3 mLs Nebulization Given 08/08/18 1213)  predniSONE (DELTASONE) tablet 20 mg (20 mg Oral Given 08/08/18 1212)    Initial Impression / Assessment and Plan / UC Course  I  have reviewed the triage vital signs and the nursing notes.  Pertinent labs & imaging results that were available during my care of the patient were reviewed by me and considered in my medical decision making (see chart for details).     Discussed with patient and her mother that she had a viral upper respiratory infection which is triggered an exacerbation of her asthma.  Discussed management and follow-up.  Reasons for return. Final Clinical Impressions(s) / UC Diagnoses   Final diagnoses:  Mild intermittent asthma with exacerbation  Viral upper respiratory tract infection     Discharge Instructions  Rest Push fluids Use a humidifier if you have one Take the prednisone 2 x a day First dose tonight Use the albuterol every 4 hours as needed wheeze Return if not improved in a couple of days, or see your PCP    ED Prescriptions    Medication Sig Dispense Auth. Provider   predniSONE (DELTASONE) 20 MG tablet Take 1 tablet (20 mg total) by mouth 2 (two) times daily with a meal. 10 tablet Eustace Moore, MD   albuterol (PROVENTIL HFA;VENTOLIN HFA) 108 (90 Base) MCG/ACT inhaler Inhale 1-2 puffs into the lungs every 6 (six) hours as needed for wheezing or shortness of breath. 1 Inhaler Eustace Moore, MD     Controlled Substance Prescriptions [place] Controlled Substance Registry consulted? Not Applicable   Eustace Moore, MD 08/08/18 [phone]

## 2018-08-08 NOTE — Discharge Instructions (Addendum)
Rest Push fluids Use a humidifier if you have one Take the prednisone 2 x a day First dose tonight Use the albuterol every 4 hours as needed wheeze Return if not improved in a couple of days, or see your PCP

## 2018-08-08 NOTE — ED Triage Notes (Signed)
Congestion, cough, SOB started this AM. Woke up with SOB, wheeze, and painful breathing.

## 2018-09-05 ENCOUNTER — Ambulatory Visit (HOSPITAL_COMMUNITY)
Admission: EM | Admit: 2018-09-05 | Discharge: 2018-09-05 | Disposition: A | Discharge status: Home / Self Care | Payer: Medicaid Other | Attending: Family Medicine | Admitting: Family Medicine

## 2018-09-05 ENCOUNTER — Encounter (HOSPITAL_COMMUNITY): Payer: Self-pay | Admitting: Emergency Medicine

## 2018-09-05 DIAGNOSIS — R197 Diarrhea, unspecified: Secondary | ICD-10-CM

## 2018-09-05 NOTE — Discharge Instructions (Signed)
Take imodium to stop the diarrhea Return as needed

## 2018-09-05 NOTE — ED Provider Notes (Signed)
[facility]    CSN: [id number] Arrival date & time: 09/05/18  8295     History   Chief Complaint Chief Complaint  Patient presents with  . Diarrhea  . Back Pain    HPI Desiree Berger is a 22 y.o. female.   HPI  Patient is here for diarrhea.  She states is been present just when she woke this morning.  She is had 3 loose bowel movements.  She has moderate abdominal cramping.  No nausea or vomiting.  No fever or chills.  No suspicious foods or thoughts of food poisoning.  No recent travel.  No known exposure to infection. She has low back pain is been present for about 2 weeks.  Its moderate in nature.  She is taking ibuprofen.  No injury or fall.  Past Medical History:  Diagnosis Date  . Asthma   . Seasonal allergies     Patient Active Problem List   Diagnosis Date Noted  . Substance induced mood disorder (HCC) 09/08/2017  . 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 MDD (major depressive disorder), single episode, severe (HCC) 01/16/2014  . GAD (generalized anxiety disorder) 01/16/2014  . Closed fracture of left toe 04/14/2012    Past Surgical History:  Procedure Laterality Date  . ADENOIDECTOMY    . TONSILLECTOMY      OB History   None      Home Medications    Prior to Admission medications   Medication Sig Start Date End Date Taking? Authorizing Provider  albuterol (PROVENTIL HFA;VENTOLIN HFA) 108 (90 Base) MCG/ACT inhaler Inhale 1-2 puffs into the lungs every 6 (six) hours as needed for wheezing or shortness of breath. 08/08/18   Eustace Moore, MD  etonogestrel (NEXPLANON) 68 MG IMPL implant 1 each once by Subdermal route.    [provider]    Family History Family History  Problem Relation Age of Onset  . Hypertension Mother   . Mental illness Mother   . Hyperlipidemia Father   . Birth defects Maternal Grandmother   . Heart attack Neg Hx   . Diabetes Neg Hx   . Sudden  death Neg Hx     Social History Social History   Tobacco Use  . Smoking status: Current Every Day Smoker  . Smokeless tobacco: Never Used  Substance Use Topics  . Alcohol use: No  . Drug use: Yes    Types: Cocaine    Comment: Stated cocaine ingestion x 2 hours.     Allergies   Patient has no known allergies.   Review of Systems Review of Systems  Constitutional: Negative for chills and fever.  HENT: Negative for ear pain and sore throat.   Eyes: Negative for pain and visual disturbance.  Respiratory: Negative for cough and shortness of breath.   Cardiovascular: Negative for chest pain and palpitations.  Gastrointestinal: Positive for abdominal pain and diarrhea. Negative for vomiting.  Genitourinary: Negative for dysuria and hematuria.  Musculoskeletal: Negative for arthralgias and back pain.  Skin: Negative for color change and rash.  Neurological: Negative for seizures and syncope.  All other systems reviewed and are negative.    Physical Exam Triage Vital Signs ED Triage Vitals  Enc Vitals Group     BP 09/05/18 1039 134/62     Pulse Rate 09/05/18 1039 86     Resp 09/05/18 1039 18     Temp 09/05/18 1039 98 F (36.7 C)     Temp  src --      SpO2 09/05/18 1039 100 %     Weight --      Height --      Head Circumference --      Peak Flow --      Pain Score 09/05/18 1040 6     Pain Loc --      Pain Edu? --      Excl. in GC? --    No data found.  Updated Vital Signs BP 134/62   Pulse 86   Temp 98 F (36.7 C)   Resp 18   SpO2 100%   Physical Exam  Constitutional: She appears well-developed and well-nourished. No distress.  HENT:  Head: Normocephalic and atraumatic.  Right Ear: External ear normal.  Left Ear: External ear normal.  Mouth/Throat: Oropharynx is clear and moist.  Eyes: Pupils are equal, round, and reactive to light. Conjunctivae are normal.  Neck: Normal range of motion.  Cardiovascular: Normal rate, regular rhythm and normal heart  sounds.  Pulmonary/Chest: Effort normal and breath sounds normal. No respiratory distress.  Abdominal: Soft. Bowel sounds are normal. She exhibits no distension.  Minimal tenderness diffusely.  Active bowel sounds  Musculoskeletal: Normal range of motion. She exhibits no edema.  Lymphadenopathy:    She has no cervical adenopathy.  Neurological: She is alert.  Skin: Skin is warm and dry.     UC Treatments / Results  Labs (all labs ordered are listed, but only abnormal results are displayed) Labs Reviewed - No data to display  EKG None  Radiology No results found.  Procedures Procedures (including critical care time)  Medications Ordered in UC Medications - No data to display  Initial Impression / Assessment and Plan / UC Course  I have reviewed the triage vital signs and the nursing notes.  Pertinent labs & imaging results that were available during my care of the patient were reviewed by me and considered in my medical decision making (see chart for details).    Patient has mild symptoms consistent with gastroenteritis.  I discussed with her keeping up with fluids, taking over-the-counter medicines, and resuming food.  She is here mostly because she needs a work note.  Final Clinical Impressions(s) / UC Diagnoses   Final diagnoses:  Diarrhea of presumed infectious origin     Discharge Instructions     Take imodium to stop the diarrhea Return as needed   ED Prescriptions    None     Controlled Substance Prescriptions [place] Controlled Substance Registry consulted? Not Applicable  Eustace Moore, MD 09/05/18 1058

## 2018-09-05 NOTE — ED Triage Notes (Signed)
Pt c/o diarrhea, states shes gone three times since this morning with nausea. Pt also c/o lower back pain.

## 2018-11-16 ENCOUNTER — Ambulatory Visit (HOSPITAL_COMMUNITY)
Admission: EM | Admit: 2018-11-16 | Discharge: 2018-11-16 | Disposition: A | Discharge status: Home / Self Care | Payer: BLUE CROSS/BLUE SHIELD | Attending: Family Medicine | Admitting: Family Medicine

## 2018-11-16 ENCOUNTER — Encounter (HOSPITAL_COMMUNITY): Payer: Self-pay | Admitting: Emergency Medicine

## 2018-11-16 DIAGNOSIS — B9789 Other viral agents as the cause of diseases classified elsewhere: Secondary | ICD-10-CM | POA: Insufficient documentation

## 2018-11-16 DIAGNOSIS — J069 Acute upper respiratory infection, unspecified: Secondary | ICD-10-CM

## 2018-11-16 DIAGNOSIS — N898 Other specified noninflammatory disorders of vagina: Secondary | ICD-10-CM | POA: Diagnosis present

## 2018-11-16 LAB — POCT URINALYSIS DIP (DEVICE)
Bilirubin Urine: NEGATIVE
Glucose, UA: NEGATIVE mg/dL
KETONES UR: NEGATIVE mg/dL
LEUKOCYTES UA: NEGATIVE
Nitrite: NEGATIVE
Protein, ur: NEGATIVE mg/dL
SPECIFIC GRAVITY, URINE: 1.025 (ref 1.005–1.030)
Urobilinogen, UA: 0.2 mg/dL (ref 0.0–1.0)
pH: 7 (ref 5.0–8.0)

## 2018-11-16 LAB — POCT PREGNANCY, URINE: Preg Test, Ur: NEGATIVE

## 2018-11-16 MED ORDER — CETIRIZINE HCL 10 MG PO CAPS: 10.0000 mg | ORAL_CAPSULE | Freq: Every day | ORAL | 0 refills | Status: DC

## 2018-11-16 MED ORDER — FLUTICASONE PROPIONATE 50 MCG/ACT NA SUSP: 1.0000 | Freq: Every day | NASAL | 0 refills | Status: DC

## 2018-11-16 MED ORDER — AMOXICILLIN-POT CLAVULANATE 875-125 MG PO TABS: 1.0000 | ORAL_TABLET | Freq: Two times a day (BID) | ORAL | 0 refills | Status: AC

## 2018-11-16 MED ORDER — FLUCONAZOLE 150 MG PO TABS: 150.0000 mg | ORAL_TABLET | Freq: Once | ORAL | 0 refills | Status: AC

## 2018-11-16 MED ORDER — PSEUDOEPH-BROMPHEN-DM 30-2-10 MG/5ML PO SYRP: 5.0000 mL | ORAL_SOLUTION | Freq: Four times a day (QID) | ORAL | 0 refills | Status: DC | PRN

## 2018-11-16 NOTE — ED Triage Notes (Addendum)
Pt has been suffering from sinus pressure and congestion, left ear pain and a cough since last Friday.  She has been taking Ibuprofen and Benadryl.  Pt reports lower back and flank pain bilaterally for several weeks.  She states her vaginal discharge is not normal and frequent urination.

## 2018-11-16 NOTE — ED Provider Notes (Signed)
[facility]    CSN: [id number] Arrival date & time: 11/16/18  1004     History   Chief Complaint Chief Complaint  Patient presents with  . URI  . Urinary Tract Infection    HPI Desiree Berger is a 23 y.o. female history of asthma presenting today for evaluation of URI symptoms and vaginal discharge.  Patient states that for the past 5 to 6 days she has had nasal congestion and sinus pressure.  She is also started developed ear discomfort.  She has had associated cough.  She has been using ibuprofen and Benadryl with minimal relief.  Initially had sore throat, but this has improved.  Denies any fevers.  Denies nausea or vomiting.  Patient has also had some vaginal discharge and lower back pain.  Symptoms have been going on for a couple weeks.  Discharge is thicker and wider than normal.  Denies any itching or irritation.  Denies dysuria.  Does endorse increased frequency.  Patient is sexually active.  On Nexplanon for birth control.  Does not have regular cycles with this.  Denies abdominal pain or pelvic pain.  HPI  Past Medical History:  Diagnosis Date  . Asthma   . Seasonal allergies     Patient Active Problem List   Diagnosis Date Noted  . Substance induced mood disorder (HCC) 09/08/2017  . 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 MDD (major depressive disorder), single episode, severe (HCC) 01/16/2014  . GAD (generalized anxiety disorder) 01/16/2014  . Closed fracture of left toe 04/14/2012    Past Surgical History:  Procedure Laterality Date  . ADENOIDECTOMY    . TONSILLECTOMY      OB History   No obstetric history on file.      Home Medications    Prior to Admission medications   Medication Sig Start Date End Date Taking? Authorizing Provider  etonogestrel (NEXPLANON) 68 MG IMPL implant 1 each once by Subdermal route.   Yes [provider]  albuterol (PROVENTIL HFA;VENTOLIN HFA)  108 (90 Base) MCG/ACT inhaler Inhale 1-2 puffs into the lungs every 6 (six) hours as needed for wheezing or shortness of breath. 08/08/18   Eustace Moore, MD  amoxicillin-clavulanate (AUGMENTIN) 875-125 MG tablet Take 1 tablet by mouth every 12 (twelve) hours for 7 days. 11/19/18 11/26/18  Brandis Matsuura C, PA-C  brompheniramine-pseudoephedrine-DM 30-2-10 MG/5ML syrup Take 5 mLs by mouth 4 (four) times daily as needed. 11/16/18   Izic Stfort C, PA-C  Cetirizine HCl 10 MG CAPS Take 1 capsule (10 mg total) by mouth daily for 10 days. 11/16/18 11/26/18  Delance Weide C, PA-C  fluconazole (DIFLUCAN) 150 MG tablet Take 1 tablet (150 mg total) by mouth once for 1 dose. 11/16/18 11/16/18  Peter Daquila C, PA-C  fluticasone (FLONASE) 50 MCG/ACT nasal spray Place 1-2 sprays into both nostrils daily for 7 days. 11/16/18 11/23/18  Judith Campillo, Junius Creamer, PA-C    Family History Family History  Problem Relation Age of Onset  . Hypertension Mother   . Mental illness Mother   . Hyperlipidemia Father   . Birth defects Maternal Grandmother   . Heart attack Neg Hx   . Diabetes Neg Hx   . Sudden death Neg Hx     Social History Social History   Tobacco Use  . Smoking status: Current Every Day Smoker  . Smokeless tobacco: Never Used  Substance Use Topics  . Alcohol use: No  . Drug use: Yes  Types: Cocaine    Comment: Stated cocaine ingestion x 2 hours.     Allergies   Patient has no known allergies.   Review of Systems Review of Systems  Constitutional: Negative for activity change, appetite change, chills, fatigue and fever.  HENT: Positive for congestion, ear pain, rhinorrhea and sinus pressure. Negative for sore throat and trouble swallowing.   Eyes: Negative for discharge and redness.  Respiratory: Positive for cough. Negative for chest tightness and shortness of breath.   Cardiovascular: Negative for chest pain.  Gastrointestinal: Negative for abdominal pain, diarrhea, nausea and  vomiting.  Genitourinary: Positive for flank pain, frequency and vaginal discharge. Negative for dysuria, genital sores, hematuria, menstrual problem, vaginal bleeding and vaginal pain.  Musculoskeletal: Negative for back pain and myalgias.  Skin: Negative for rash.  Neurological: Negative for dizziness, light-headedness and headaches.     Physical Exam Triage Vital Signs ED Triage Vitals [11/16/18 1049]  Enc Vitals Group     BP 127/75     Pulse Rate 69     Resp      Temp 98.3 F (36.8 C)     Temp Source Temporal     SpO2 100 %     Weight      Height      Head Circumference      Peak Flow      Pain Score 7     Pain Loc      Pain Edu?      Excl. in GC?    No data found.  Updated Vital Signs BP 127/75 (BP Location: Right Arm)   Pulse 69   Temp 98.3 F (36.8 C) (Temporal)   SpO2 100%   Visual Acuity Right Eye Distance:   Left Eye Distance:   Bilateral Distance:    Right Eye Near:   Left Eye Near:    Bilateral Near:     Physical Exam Vitals signs and nursing note reviewed.  Constitutional:      General: She is not in acute distress.    Appearance: She is well-developed.  HENT:     Head: Normocephalic and atraumatic.     Ears:     Comments: Bilateral ears without tenderness to palpation of external auricle, tragus and mastoid, EAC's without erythema or swelling, TM's with good bony landmarks and cone of light. Non erythematous.    Nose:     Comments: Nasal mucosa erythematous, nonswollen turbinates    Mouth/Throat:     Comments: Oral mucosa pink and moist, no tonsillar enlargement or exudate. Posterior pharynx patent and nonerythematous, no uvula deviation or swelling. Normal phonation. Eyes:     Conjunctiva/sclera: Conjunctivae normal.  Neck:     Musculoskeletal: Neck supple.  Cardiovascular:     Rate and Rhythm: Normal rate and regular rhythm.     Heart sounds: No murmur.  Pulmonary:     Effort: Pulmonary effort is normal. No respiratory distress.      Breath sounds: Normal breath sounds.     Comments: Breathing comfortably at rest, CTABL, no wheezing, rales or other adventitious sounds auscultated Abdominal:     Palpations: Abdomen is soft.     Tenderness: There is no abdominal tenderness.     Comments: Nontender light deep palpation throughout abdomen  Genitourinary:    Comments: Deferred Skin:    General: Skin is warm and dry.  Neurological:     Mental Status: She is alert.      UC Treatments / Results  Labs (all  labs ordered are listed, but only abnormal results are displayed) Labs Reviewed  POCT URINALYSIS DIP (DEVICE) - Abnormal; Notable for the following components:      Result Value   Hgb urine dipstick SMALL (*)    All other components within normal limits  POC URINE PREG, ED  POCT PREGNANCY, URINE  CERVICOVAGINAL ANCILLARY ONLY    EKG None  Radiology No results found.  Procedures Procedures (including critical care time)  Medications Ordered in UC Medications - No data to display  Initial Impression / Assessment and Plan / UC Course  I have reviewed the triage vital signs and the nursing notes.  Pertinent labs & imaging results that were available during my care of the patient were reviewed by me and considered in my medical decision making (see chart for details).     Patient with URI symptoms less than 1 week.  Most likely viral etiology, vital signs stable, exam nonfocal.  Will recommend continued symptomatic and supportive care.  Did provide prescription for Augmentin to fill on Saturday if not having any improvement with recommendations today over the next 3 to 4 days.  UA negative for signs of UTI, thicker white discharge suggestive of yeast.  Will treat as such with Diflucan.  Vaginal swab obtained and will send off to check for other causes as well as to confirm.  Will alter treatment if needed.  Discussed strict return precautions. Patient verbalized understanding and is agreeable with  plan.  Final Clinical Impressions(s) / UC Diagnoses   Final diagnoses:  Viral URI with cough  Vaginal discharge     Discharge Instructions     Please begin daily cetirizine/Zyrtec or Claritin, you may get this generic over-the-counter if is cheaper for you Please also use Flonase nasal spray 1 to 2 sprays in each nostril Please use cough syrup as needed  May fill prescription for Augmentin on Saturday if you are not having any improvement of your symptoms  I have sent in Diflucan to treat for yeast infection, we will send the swab off to check for causes of your discharge and we will call you with the results.  We will send any further medicines if needed.  Please follow-up if symptoms not resolving or worsening   ED Prescriptions    Medication Sig Dispense Auth. Provider   Cetirizine HCl 10 MG CAPS Take 1 capsule (10 mg total) by mouth daily for 10 days. 10 capsule Deloris Mittag C, PA-C   fluticasone (FLONASE) 50 MCG/ACT nasal spray Place 1-2 sprays into both nostrils daily for 7 days. 1 g Chi Garlow C, PA-C   brompheniramine-pseudoephedrine-DM 30-2-10 MG/5ML syrup Take 5 mLs by mouth 4 (four) times daily as needed. 120 mL Keontre Defino C, PA-C   amoxicillin-clavulanate (AUGMENTIN) 875-125 MG tablet Take 1 tablet by mouth every 12 (twelve) hours for 7 days. 14 tablet Billye Nydam C, PA-C   fluconazole (DIFLUCAN) 150 MG tablet Take 1 tablet (150 mg total) by mouth once for 1 dose. 2 tablet Everrett Lacasse C, PA-C     Controlled Substance Prescriptions [place] Controlled Substance Registry consulted? Not Applicable   Lew Dawes, [place] 11/16/18 1154

## 2018-11-16 NOTE — Discharge Instructions (Signed)
Please begin daily cetirizine/Zyrtec or Claritin, you may get this generic over-the-counter if is cheaper for you Please also use Flonase nasal spray 1 to 2 sprays in each nostril Please use cough syrup as needed  May fill prescription for Augmentin on Saturday if you are not having any improvement of your symptoms  I have sent in Diflucan to treat for yeast infection, we will send the swab off to check for causes of your discharge and we will call you with the results.  We will send any further medicines if needed.  Please follow-up if symptoms not resolving or worsening

## 2018-11-17 LAB — CERVICOVAGINAL ANCILLARY ONLY
BACTERIAL VAGINITIS: POSITIVE — AB
CANDIDA VAGINITIS: NEGATIVE
CHLAMYDIA, DNA PROBE: NEGATIVE
Neisseria Gonorrhea: NEGATIVE
TRICH (WINDOWPATH): NEGATIVE

## 2018-11-18 ENCOUNTER — Telehealth: Payer: Self-pay | Admitting: Emergency Medicine

## 2018-11-18 MED ORDER — METRONIDAZOLE 500 MG PO TABS: 500.0000 mg | ORAL_TABLET | Freq: Two times a day (BID) | ORAL | 0 refills | Status: AC

## 2018-11-18 NOTE — Telephone Encounter (Signed)
Patient positive for BV, Flagyl sent to pharmacy, will call patient with results.

## 2019-09-01 ENCOUNTER — Other Ambulatory Visit: Payer: Self-pay

## 2019-09-01 ENCOUNTER — Encounter (HOSPITAL_COMMUNITY): Payer: Self-pay | Admitting: Emergency Medicine

## 2019-09-01 ENCOUNTER — Emergency Department (HOSPITAL_COMMUNITY)
Admission: EM | Admit: 2019-09-01 | Discharge: 2019-09-01 | Disposition: A | Discharge status: Other Acute Inpatient Hospital | Payer: BC Managed Care – PPO | Source: Home / Self Care | Attending: Emergency Medicine | Admitting: Emergency Medicine

## 2019-09-01 ENCOUNTER — Encounter (HOSPITAL_COMMUNITY): Payer: Self-pay | Admitting: Psychiatry

## 2019-09-01 ENCOUNTER — Inpatient Hospital Stay (HOSPITAL_COMMUNITY)
Admission: AD | Admit: 2019-09-01 | Discharge: 2019-09-04 | DRG: 885 | Disposition: A | Discharge status: Home / Self Care | Payer: BC Managed Care – PPO | Source: Intra-hospital | Attending: Psychiatry | Admitting: Psychiatry

## 2019-09-01 DIAGNOSIS — J45909 Unspecified asthma, uncomplicated: Secondary | ICD-10-CM | POA: Insufficient documentation

## 2019-09-01 DIAGNOSIS — Z23 Encounter for immunization: Secondary | ICD-10-CM

## 2019-09-01 DIAGNOSIS — Z7289 Other problems related to lifestyle: Secondary | ICD-10-CM

## 2019-09-01 DIAGNOSIS — Z9114 Patient's other noncompliance with medication regimen: Secondary | ICD-10-CM

## 2019-09-01 DIAGNOSIS — F1220 Cannabis dependence, uncomplicated: Secondary | ICD-10-CM | POA: Insufficient documentation

## 2019-09-01 DIAGNOSIS — F322 Major depressive disorder, single episode, severe without psychotic features: Secondary | ICD-10-CM | POA: Diagnosis present

## 2019-09-01 DIAGNOSIS — F17210 Nicotine dependence, cigarettes, uncomplicated: Secondary | ICD-10-CM | POA: Diagnosis present

## 2019-09-01 DIAGNOSIS — Z20828 Contact with and (suspected) exposure to other viral communicable diseases: Secondary | ICD-10-CM | POA: Diagnosis present

## 2019-09-01 DIAGNOSIS — Z915 Personal history of self-harm: Secondary | ICD-10-CM | POA: Diagnosis not present

## 2019-09-01 DIAGNOSIS — F17200 Nicotine dependence, unspecified, uncomplicated: Secondary | ICD-10-CM | POA: Insufficient documentation

## 2019-09-01 DIAGNOSIS — F411 Generalized anxiety disorder: Secondary | ICD-10-CM | POA: Insufficient documentation

## 2019-09-01 DIAGNOSIS — F323 Major depressive disorder, single episode, severe with psychotic features: Secondary | ICD-10-CM | POA: Insufficient documentation

## 2019-09-01 DIAGNOSIS — F1290 Cannabis use, unspecified, uncomplicated: Secondary | ICD-10-CM

## 2019-09-01 DIAGNOSIS — R45851 Suicidal ideations: Secondary | ICD-10-CM | POA: Diagnosis present

## 2019-09-01 DIAGNOSIS — F250 Schizoaffective disorder, bipolar type: Secondary | ICD-10-CM | POA: Diagnosis present

## 2019-09-01 DIAGNOSIS — F333 Major depressive disorder, recurrent, severe with psychotic symptoms: Secondary | ICD-10-CM

## 2019-09-01 DIAGNOSIS — R45850 Homicidal ideations: Secondary | ICD-10-CM | POA: Diagnosis present

## 2019-09-01 LAB — SALICYLATE LEVEL: Salicylate Lvl: <7 mg/dL (ref 2.8–30.0)

## 2019-09-01 LAB — COMPREHENSIVE METABOLIC PANEL
ALT: 12 U/L (ref 0–44)
AST: 16 U/L (ref 15–41)
Albumin: 4.7 g/dL (ref 3.5–5.0)
Alkaline Phosphatase: 40 U/L (ref 38–126)
Anion gap: 11 (ref 5–15)
BUN: 11 mg/dL (ref 6–20)
CO2: 20 mmol/L — ABNORMAL LOW (ref 22–32)
Calcium: 8.8 mg/dL — ABNORMAL LOW (ref 8.9–10.3)
Chloride: 107 mmol/L (ref 98–111)
Creatinine, Ser: 0.73 mg/dL (ref 0.44–1.00)
GFR calc Af Amer: >60 mL/min (ref >60)
GFR calc non Af Amer: >60 mL/min (ref >60)
Glucose, Bld: 98 mg/dL (ref 70–99)
Potassium: 3.3 mmol/L — ABNORMAL LOW (ref 3.5–5.1)
Sodium: 138 mmol/L (ref 135–145)
Total Bilirubin: 0.8 mg/dL (ref 0.3–1.2)
Total Protein: 7.4 g/dL (ref 6.5–8.1)

## 2019-09-01 LAB — RAPID URINE DRUG SCREEN, HOSP PERFORMED
Amphetamines: NOT DETECTED
Barbiturates: NOT DETECTED
Benzodiazepines: NOT DETECTED
Cocaine: NOT DETECTED
Opiates: NOT DETECTED
Tetrahydrocannabinol: POSITIVE — AB

## 2019-09-01 LAB — CBC
HCT: 44.6 % (ref 36.0–46.0)
Hemoglobin: 15 g/dL (ref 12.0–15.0)
MCH: 31.5 pg (ref 26.0–34.0)
MCHC: 33.6 g/dL (ref 30.0–36.0)
MCV: 93.7 fL (ref 80.0–100.0)
Platelets: 294 10*3/uL (ref 150–400)
RBC: 4.76 MIL/uL (ref 3.87–5.11)
RDW: 12.1 % (ref 11.5–15.5)
WBC: 12.1 10*3/uL — ABNORMAL HIGH (ref 4.0–10.5)
nRBC: 0 % (ref 0.0–0.2)

## 2019-09-01 LAB — I-STAT BETA HCG BLOOD, ED (MC, WL, AP ONLY): I-stat hCG, quantitative: <5 m[IU]/mL (ref <5)

## 2019-09-01 LAB — SARS CORONAVIRUS 2 BY RT PCR (HOSPITAL ORDER, PERFORMED IN ~~LOC~~ HOSPITAL LAB): SARS Coronavirus 2: NEGATIVE

## 2019-09-01 LAB — ACETAMINOPHEN LEVEL: Acetaminophen (Tylenol), Serum: <10 ug/mL — ABNORMAL LOW (ref 10–30)

## 2019-09-01 LAB — ETHANOL: Alcohol, Ethyl (B): <10 mg/dL (ref <10)

## 2019-09-01 MED ORDER — PNEUMOCOCCAL VAC POLYVALENT 25 MCG/0.5ML IJ INJ
0.5000 mL | INJECTION | INTRAMUSCULAR | Status: AC
  Administered 2019-09-02: 13:00:00 0.5 mL via INTRAMUSCULAR

## 2019-09-01 MED ORDER — ALBUTEROL SULFATE HFA 108 (90 BASE) MCG/ACT IN AERS: 1.0000 | INHALATION_SPRAY | Freq: Four times a day (QID) | RESPIRATORY_TRACT | Status: DC | PRN

## 2019-09-01 MED ORDER — FLUTICASONE PROPIONATE 50 MCG/ACT NA SUSP: 2.0000 | Freq: Two times a day (BID) | NASAL | Status: DC | PRN

## 2019-09-01 MED ORDER — BENZTROPINE MESYLATE 0.5 MG PO TABS
0.5000 mg | ORAL_TABLET | Freq: Two times a day (BID) | ORAL | Status: DC
  Administered 2019-09-01 – 2019-09-04 (×6): 0.5 mg via ORAL
  Filled 2019-09-01 (×10): qty 1

## 2019-09-01 MED ORDER — ACETAMINOPHEN 500 MG PO TABS
1000.0000 mg | ORAL_TABLET | Freq: Three times a day (TID) | ORAL | Status: DC | PRN
  Administered 2019-09-01: 1000 mg via ORAL
  Filled 2019-09-01: qty 2

## 2019-09-01 MED ORDER — LORAZEPAM 2 MG/ML IJ SOLN: 2.0000 mg | Freq: Four times a day (QID) | INTRAMUSCULAR | Status: DC | PRN

## 2019-09-01 MED ORDER — ENSURE ENLIVE PO LIQD
237.0000 mL | Freq: Two times a day (BID) | ORAL | Status: DC
  Administered 2019-09-02 – 2019-09-04 (×3): 237 mL via ORAL

## 2019-09-01 MED ORDER — LORAZEPAM 1 MG PO TABS
1.0000 mg | ORAL_TABLET | Freq: Once | ORAL | Status: AC
  Administered 2019-09-01: 05:00:00 1 mg via ORAL
  Filled 2019-09-01: qty 1

## 2019-09-01 MED ORDER — TEMAZEPAM 30 MG PO CAPS
30.0000 mg | ORAL_CAPSULE | Freq: Every day | ORAL | Status: DC
  Filled 2019-09-01: qty 1

## 2019-09-01 MED ORDER — MAGNESIUM HYDROXIDE 400 MG/5ML PO SUSP: 30.0000 mL | Freq: Every day | ORAL | Status: DC | PRN

## 2019-09-01 MED ORDER — HYDROXYZINE HCL 25 MG PO TABS
25.0000 mg | ORAL_TABLET | Freq: Four times a day (QID) | ORAL | Status: DC | PRN
  Administered 2019-09-01: 25 mg via ORAL
  Filled 2019-09-01: qty 1

## 2019-09-01 MED ORDER — ACETAMINOPHEN 325 MG PO TABS: 650.0000 mg | ORAL_TABLET | Freq: Four times a day (QID) | ORAL | Status: DC | PRN

## 2019-09-01 MED ORDER — ALUM & MAG HYDROXIDE-SIMETH 200-200-20 MG/5ML PO SUSP: 30.0000 mL | ORAL | Status: DC | PRN

## 2019-09-01 MED ORDER — INFLUENZA VAC SPLIT QUAD 0.5 ML IM SUSY
0.5000 mL | PREFILLED_SYRINGE | INTRAMUSCULAR | Status: AC
  Administered 2019-09-02: 13:00:00 0.5 mL via INTRAMUSCULAR
  Filled 2019-09-01: qty 0.5

## 2019-09-01 MED ORDER — CARBAMAZEPINE 100 MG PO CHEW
100.0000 mg | CHEWABLE_TABLET | Freq: Three times a day (TID) | ORAL | Status: DC
  Administered 2019-09-01 – 2019-09-04 (×9): 100 mg via ORAL
  Filled 2019-09-01 (×15): qty 1

## 2019-09-01 MED ORDER — RISPERIDONE 2 MG PO TABS
2.0000 mg | ORAL_TABLET | Freq: Two times a day (BID) | ORAL | Status: DC
  Administered 2019-09-01 – 2019-09-02 (×2): 2 mg via ORAL
  Filled 2019-09-01 (×4): qty 1

## 2019-09-01 MED ORDER — TRAZODONE HCL 150 MG PO TABS: 150.0000 mg | ORAL_TABLET | Freq: Every evening | ORAL | Status: DC | PRN

## 2019-09-01 NOTE — Tx Team (Signed)
Initial Treatment Plan 09/01/2019 5:14 PM Desiree Berger GHW:[id number]    PATIENT STRESSORS: Loss of support system related to family conflicts Other: Disturbed thought process   PATIENT STRENGTHS: Ability for insight Communication skills Motivation for treatment/growth Work skills   PATIENT IDENTIFIED PROBLEMS: hallucinations  depression                   DISCHARGE CRITERIA:  Adequate post-discharge living arrangements Improved stabilization in mood, thinking, and/or behavior Verbal commitment to aftercare and medication compliance  PRELIMINARY DISCHARGE PLAN: Outpatient therapy Participate in family therapy Return to previous work or school arrangements  PATIENT/FAMILY INVOLVEMENT: This treatment plan has been presented to and reviewed with the patient, Desiree Berger.  The patient has been given the opportunity to ask questions and make suggestions.  Ronelle Nigh, RN 09/01/2019, 5:14 PM

## 2019-09-01 NOTE — Progress Notes (Signed)
09/01/19  0818  Called [place] [phone] to give report. Waiting for return call.

## 2019-09-01 NOTE — Progress Notes (Signed)
Patient mentioned that she would rather live with her mother. Reports that father "does not care when I talk to him..he is a mean person".
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
## 2019-09-01 NOTE — [facility] Suicide Risk Assessment (Signed)
[facility] Admission Suicide Risk Assessment   Nursing information obtained from:  Patient Demographic factors:  Caucasian Current Mental Status:  NA Loss Factors:  NA Historical Factors:  NA Risk Reduction Factors:  NA  Total Time spent with patient: 45 minutes Principal Problem: <principal problem not specified> Diagnosis:  Active Problems:   MDD (major depressive disorder), single episode, severe (HCC)   Schizoaffective disorder, bipolar type ([place])  Subjective Data: admitted for psychosis   Continued Clinical Symptoms:    The "Alcohol Use Disorders Identification Test", Guidelines for Use in Primary Care, Second Edition.  World Pharmacologist [facility]). Score between 0-7:  no or low risk or alcohol related problems. Score between 8-15:  moderate risk of alcohol related problems. Score between 16-19:  high risk of alcohol related problems. Score 20 or above:  warrants further diagnostic evaluation for alcohol dependence and treatment.   CLINICAL FACTORS:   Alcohol/Substance Abuse/Dependencies   Musculoskeletal: Strength & Muscle Tone: within normal limits Gait & Station: normal Patient leans: N/A  Psychiatric Specialty Exam: Physical Exam  Nursing note and vitals reviewed. Constitutional: She appears well-developed and well-nourished.  Cardiovascular: Normal rate and regular rhythm.    Review of Systems  Constitutional: Negative.   Eyes: Negative.   Cardiovascular: Negative.   Gastrointestinal: Negative.   Genitourinary: Negative.   Neurological: Negative.   Endo/Heme/Allergies: Negative.     Last menstrual period 08/18/2019.There is no height or weight on file to calculate BMI.  General Appearance: Disheveled  Eye Contact:  Minimal  Speech:  Slow  Volume:  Decreased  Mood:  Dysphoric  Affect:  Flat  Thought Process:  Coherent and Descriptions of Associations: Loose  Orientation:  Full (Time, Place, and Person)  Thought Content:  Hallucinations:  Auditory Visual and Rumination  Suicidal Thoughts:  Yes.  without intent/plan  Homicidal Thoughts:  No  Memory:  Immediate;   Fair Recent;   Fair Remote;   Fair  Judgement:  Fair  Insight:  Fair  Psychomotor Activity:  Normal  Concentration:  Concentration: Fair and Attention Span: Fair  Recall:  [employer] of Knowledge:  Fair  Language:  Soft-  Akathisia:  Negative  Handed:  Right  AIMS (if indicated):     Assets:  Physical Health Resilience  ADL's:  Intact  Cognition:  WNL  Sleep:        COGNITIVE FEATURES THAT CONTRIBUTE TO RISK:  Loss of executive function    SUICIDE RISK:   Moderate:  Frequent suicidal ideation with limited intensity, and duration, some specificity in terms of plans, no associated intent, good self-control, limited dysphoria/symptomatology, some risk factors present, and identifiable protective factors, including available and accessible social support.  PLAN OF CARE: see eval  I certify that inpatient services furnished can reasonably be expected to improve the patient's condition.   Johnn Hai, MD 09/01/2019, 2:52 PM

## 2019-09-01 NOTE — Plan of Care (Signed)
Newly admitted. Alert and oriented. Cooperative and adjusting well to the unit.

## 2019-09-01 NOTE — ED Notes (Signed)
Patient was given breakfast tray.   Patient stated she wasn't having any SI or HI this morning. Patient was calm, pleasant, and cooperative.

## 2019-09-01 NOTE — BH Assessment (Signed)
[place] Assessment Progress Note   Clinician talked to [facility] who said that pending a negative COVID, patient could be accepted to [facility].  Dr. Sheppard Evens will be attending psychiatrist.  Daytime administrative staff to coordinate patient transfer after COVID test results.

## 2019-09-01 NOTE — Progress Notes (Signed)
09/01/19  0912 Report given to Almyra at [facility].

## 2019-09-01 NOTE — Progress Notes (Signed)
Patient ID: Desiree Berger, female   DOB: 09-19-1996, 23 y.o.   MRN: [id number] Patient presents with increased anxiety related to visual and auditory hallucinations. Reports that she has been hearing vague voices, saying "take that... kill her". Patient sees traces of lights, dots and "other things that don't make sense". Patient became tearful when describing her hallucinations and anxious about it. Reports that she lives with her father and brother. Mother does not live with them:  lives with  Patient's grandmother. Patient would like to go and live with her mother because mother seems to be more supportive. However, there is not enough room where mother lives. Patient reports that she smokes cigarette on daily basis, about a pack a day. She smokes Marijuana about three times a week. Last intake was yesterday. Denies history of abuse. Denies suicidal thoughts. Reports that the voices are interfering with her daily activities. Patient is restless, crying frequently. Skin assessment performed by this Probation officer, assisted by Santiago Glad, MHT. No skin issues noted. Patient was admitted and oriented to the unit. Safety precautions initiated.

## 2019-09-01 NOTE — H&P (Signed)
Psychiatric Admission Assessment Adult  Patient Identification: Desiree Berger MRN:  [id number] Date of Evaluation:  09/01/2019 Chief Complaint:  Psychosis Principal Diagnosis: Schizoaffective depressed/cannabis dependency Diagnosis:  Active Problems:   MDD (major depressive disorder), single episode, severe (HCC)   Schizoaffective disorder, bipolar type (HCC)  History of Present Illness:   This is the third psychiatric admission here and overall for [name], she is 23, her first admission was at age 23 after an overdose on ibuprofen, her second admission was in 2018 with a diagnosis of psychosis involving hallucinations/delusions/and substance abuse.  She is chronically dependent upon cannabis. She states she is here this time because she phone police, was having "suicidal and homicidal thoughts" elaborates that she is "attempted suicide many times" and has "lost count" according to past notes.  Again she is cannabis dependent and acknowledges that "sobriety" from cannabis will cure most of her problems.  But more recently she is not taking any psychotropic medications and she reports auditory hallucinations that sound like they are "inside" her head telling her to harm her self and further, though she tells me she does not recognize the voice she described to another clinician is a woman who "sold her cocaine" She reports visual hallucinations that she describes "scary" but when she elaborated she said they are visual floaters and colored dots.  Current mental status exam-alert oriented to person place time day and situation and knows the holiday that is coming up so forth is conversant makes fair eye contact speech is soft and slow acknowledges feeling somewhat depressed reports intermittent episodes of feeling hyperactive without racing thoughts but states she will go without sleep and will eventually "go to sleep" after 4 to 5 days of this feeling.  She denies wanting to harm  others.  She denies wanting to harm herself while she is here and understands what it is to contract for safety.  According to the assessment team Desiree Berger is an 23 y.o. female.  -Clinician reviewed note by Antony Madura, PA.  Patient presents to the ED for c/o auditory hallucinations saying to "kill her" and "hurt her". Reports the voices are referring to a female who sold her cocaine a few months ago. She sees visual hallucinations at times characterized as spots in her vision. Has also been experiencing intermittent suicidal and homicidal ideations without plan. Triage note, however, reports plan to "take a bunch of pills". Has been noncompliant with her psychiatric medications for a while citing self-medicating with marijuana. Also reports that she did not like how the medications made her feel. Denies any other illicit drug use in the last 1 to 2 days.  Patient said that she had called the police tonight because she was having thoughts of killing herself.  She has also been hearing voices telling her to harm herself and others.    Patient says that she has had thoughts of overdosing on medications by "taking a bunch of pills."  She has access to meds in her home.  Patient reports multiple suicide attempts in the past.  Patient said that voices tell her to "hurt her" or "kill her" but she has no plan or intention.  Patient says "I know what is right and wrong."    Patient hearing voices.  Also seeing spots in her field of vision.    Patient uses marijuana daily.  She has used cocaine in the past but says that she has not used any cocaine for two years.  Patient says  that she has not been using prescribed medications for close to two years.  She says that she does not like how they make her feel.  Patient says she feels anxious most of the time.  She reports getting about 5 hours of sleep per night and has not had much of a appetite over the last few days.  One stressor  is that she broke up with her boyfriend a few days ago.  She found out something about his past and decided to break up.   Patient has a anxious affect and cries at times during assessment.  She hesitates at times as though trying to find words.  Her eye contact is good.  Patient shows signs of responding to internal stimuli.  She is oriented x3.  Pt says she is depressed, isolates and has lost interest in things she used to like.  She lacks motivation to get ready for her job.  Patient was at [facility] in 09/2017 and 12/2013.  She says she set up an appointment with a therapist at [facility] for this Monday (11/02).  She says "I don't think I can wait that long."  Patient is willing to sign in voluntarily.  -Clinician discussed patient care with Renaye Rakers, NP who recommends inpatient psychiatric care.  Clinician informed Antony Madura, PA of disposition.  She is ordering the COVID testing. Associated Signs/Symptoms: Depression Symptoms:  psychomotor retardation, (Hypo) Manic Symptoms:  Hallucinations, Anxiety Symptoms:  Excessive Worry, Psychotic Symptoms:  Hallucinations: Auditory Visual PTSD Symptoms: NA Total Time spent with patient: 45 minutes  Past Psychiatric History: no new data  Is the patient at risk to self? Yes.    Has the patient been a risk to self in the past 6 months? Yes.    Has the patient been a risk to self within the distant past? Yes.    Is the patient a risk to others? No.  Has the patient been a risk to others in the past 6 months? No.  Has the patient been a risk to others within the distant past? No.   Prior Inpatient Therapy:  poor complaince Prior Outpatient Therapy:  3rd admit  Alcohol Screening:   Substance Abuse History in the last 12 months:  Yes.   Consequences of Substance Abuse: Medical Consequences:  cannabis-induced psychosis Previous Psychotropic Medications: Yes  Psychological Evaluations: No  Past Medical History:  Past Medical  History:  Diagnosis Date  . Asthma   . Seasonal allergies     Past Surgical History:  Procedure Laterality Date  . ADENOIDECTOMY    . TONSILLECTOMY     Family History:  Family History  Problem Relation Age of Onset  . Hypertension Mother   . Mental illness Mother   . Hyperlipidemia Father   . Birth defects Maternal Grandmother   . Heart attack Neg Hx   . Diabetes Neg Hx   . Sudden death Neg Hx    Family Psychiatric  History: neg Tobacco Screening:   Social History:  Social History   Substance and Sexual Activity  Alcohol Use No     Social History   Substance and Sexual Activity  Drug Use Yes  . Types: Cocaine   Comment: Stated cocaine ingestion x 2 hours.    Additional Social History:                           Allergies:  No Known Allergies Lab Results:  Results  for orders placed or performed during the hospital encounter of 09/01/19 (from the past 48 hour(s))  Rapid urine drug screen (hospital performed)     Status: Abnormal   Collection Time: 09/01/19  3:45 AM  Result Value Ref Range   Opiates NONE DETECTED NONE DETECTED   Cocaine NONE DETECTED NONE DETECTED   Benzodiazepines NONE DETECTED NONE DETECTED   Amphetamines NONE DETECTED NONE DETECTED   Tetrahydrocannabinol POSITIVE (A) NONE DETECTED   Barbiturates NONE DETECTED NONE DETECTED    Comment: (NOTE) DRUG SCREEN FOR MEDICAL PURPOSES ONLY.  IF CONFIRMATION IS NEEDED FOR ANY PURPOSE, NOTIFY LAB WITHIN 5 DAYS. LOWEST DETECTABLE LIMITS FOR URINE DRUG SCREEN Drug Class                     Cutoff (ng/mL) Amphetamine and metabolites    1000 Barbiturate and metabolites    200 Benzodiazepine                 200 Tricyclics and metabolites     300 Opiates and metabolites        300 Cocaine and metabolites        300 THC                            50 Performed at [facility], [address] [place], [place] [address]   Comprehensive metabolic panel     Status: Abnormal    Collection Time: 09/01/19  3:47 AM  Result Value Ref Range   Sodium 138 135 - 145 mmol/L   Potassium 3.3 (L) 3.5 - 5.1 mmol/L   Chloride 107 98 - 111 mmol/L   CO2 20 (L) 22 - 32 mmol/L   Glucose, Bld 98 70 - 99 mg/dL   BUN 11 6 - 20 mg/dL   Creatinine, Ser 6.04 0.44 - 1.00 mg/dL   Calcium 8.8 (L) 8.9 - 10.3 mg/dL   Total Protein 7.4 6.5 - 8.1 g/dL   Albumin 4.7 3.5 - 5.0 g/dL   AST 16 15 - 41 U/L   ALT 12 0 - 44 U/L   Alkaline Phosphatase 40 38 - 126 U/L   Total Bilirubin 0.8 0.3 - 1.2 mg/dL   GFR calc non Af Amer >60 >60 mL/min   GFR calc Af Amer >60 >60 mL/min   Anion gap 11 5 - 15    Comment: Performed at [facility], [address], [place], [place] [address]  cbc     Status: Abnormal   Collection Time: 09/01/19  3:47 AM  Result Value Ref Range   WBC 12.1 (H) 4.0 - 10.5 K/uL   RBC 4.76 3.87 - 5.11 MIL/uL   Hemoglobin 15.0 12.0 - 15.0 g/dL   HCT 11.9 14.7 - 82.9 %   MCV 93.7 80.0 - 100.0 fL   MCH 31.5 26.0 - 34.0 pg   MCHC 33.6 30.0 - 36.0 g/dL   RDW 56.2 13.0 - 86.5 %   Platelets 294 150 - 400 K/uL   nRBC 0.0 0.0 - 0.2 %    Comment: Performed at [facility], [address] [place], [place] [address]  Ethanol     Status: None   Collection Time: 09/01/19  3:48 AM  Result Value Ref Range   Alcohol, Ethyl (B) <10 <10 mg/dL    Comment: (NOTE) Lowest detectable limit for serum alcohol is 10 mg/dL. For medical purposes only. Performed at  [facility], [address], [place], [place] [address]   Salicylate level     Status: None   Collection Time: 09/01/19  3:48 AM  Result Value Ref Range   Salicylate Lvl <7.0 2.8 - 30.0 mg/dL    Comment: Performed at [facility], [address], [place], [place] [address]  Acetaminophen level     Status: Abnormal   Collection Time: 09/01/19  3:48 AM  Result Value Ref Range   Acetaminophen (Tylenol), Serum <10 (L) 10 - 30 ug/mL    Comment:  (NOTE) Therapeutic concentrations vary significantly. A range of 10-30 ug/mL  may be an effective concentration for many patients. However, some  are best treated at concentrations outside of this range. Acetaminophen concentrations >150 ug/mL at 4 hours after ingestion  and >50 ug/mL at 12 hours after ingestion are often associated with  toxic reactions. Performed at [facility], [address] [place], [place] [address]   I-Stat beta hCG blood, ED     Status: None   Collection Time: 09/01/19  3:57 AM  Result Value Ref Range   I-stat hCG, quantitative <5.0 <5 mIU/mL   Comment 3            Comment:   GEST. AGE      CONC.  (mIU/mL)   <=1 WEEK        5 - 50     2 WEEKS       50 - 500     3 WEEKS       100 - 10,000     4 WEEKS     1,000 - 30,000        FEMALE AND NON-PREGNANT FEMALE:     LESS THAN 5 mIU/mL   SARS Coronavirus 2 by RT PCR (hospital order, performed in [facility] hospital lab) Nasopharyngeal Nasopharyngeal Swab     Status: None   Collection Time: 09/01/19  5:41 AM   Specimen: Nasopharyngeal Swab  Result Value Ref Range   SARS Coronavirus 2 NEGATIVE NEGATIVE    Comment: (NOTE) If result is NEGATIVE SARS-CoV-2 target nucleic acids are NOT DETECTED. The SARS-CoV-2 RNA is generally detectable in upper and lower  respiratory specimens during the acute phase of infection. The lowest  concentration of SARS-CoV-2 viral copies this assay can detect is 250  copies / mL. A negative result does not preclude SARS-CoV-2 infection  and should not be used as the sole basis for treatment or other  patient management decisions.  A negative result may occur with  improper specimen collection / handling, submission of specimen other  than nasopharyngeal swab, presence of viral mutation(s) within the  areas targeted by this assay, and inadequate number of viral copies  (<250 copies / mL). A negative result must be combined with clinical  observations, patient  history, and epidemiological information. If result is POSITIVE SARS-CoV-2 target nucleic acids are DETECTED. The SARS-CoV-2 RNA is generally detectable in upper and lower  respiratory specimens dur ing the acute phase of infection.  Positive  results are indicative of active infection with SARS-CoV-2.  Clinical  correlation with patient history and other diagnostic information is  necessary to determine patient infection status.  Positive results do  not rule out bacterial infection or co-infection with other viruses. If result is PRESUMPTIVE POSTIVE SARS-CoV-2 nucleic acids MAY BE PRESENT.   A presumptive positive result was obtained on the submitted specimen  and confirmed on repeat testing.  While  2019 novel coronavirus  (SARS-CoV-2) nucleic acids may be present in the submitted sample  additional confirmatory testing may be necessary for epidemiological  and / or clinical management purposes  to differentiate between  SARS-CoV-2 and other Sarbecovirus currently known to infect humans.  If clinically indicated additional testing with an alternate test  methodology [phone]([id number]) is advised. The SARS-CoV-2 RNA is generally  detectable in upper and lower respiratory sp ecimens during the acute  phase of infection. The expected result is Negative. Fact Sheet for Patients:  BoilerBrush.com.cyhttps://www.fda.gov/media/136312/download Fact Sheet for Healthcare Providers: https://pope.com/https://www.fda.gov/media/136313/download This test is not yet approved or cleared by the [place] FDA and has been authorized for detection and/or diagnosis of SARS-CoV-2 by FDA under an Emergency Use Authorization (EUA).  This EUA will remain in effect (meaning this test can be used) for the duration of the COVID-19 declaration under Section 564(b)(1) of the Act, 21 U.S.C. section 360bbb-3(b)(1), unless the authorization is terminated or revoked sooner. Performed at [facility], [address] [place],  [place] [id number]     Blood Alcohol level:  Lab Results  Component Value Date   [facility] 09/01/2019   ETH <10 09/07/2017    Metabolic Disorder Labs:  Lab Results  Component Value Date   HGBA1C 5.0 09/10/2017   MPG 96.8 09/10/2017   Lab Results  Component Value Date   PROLACTIN 108.7 (H) 09/10/2017   PROLACTIN 62.8 01/16/2014   Lab Results  Component Value Date   CHOL 145 09/09/2017   TRIG 122 09/09/2017   HDL 39 (L) 09/09/2017   CHOLHDL 3.7 09/09/2017   VLDL 24 09/09/2017   LDLCALC 82 09/09/2017   LDLCALC 81 01/16/2014    Current Medications: No current facility-administered medications for this encounter.    PTA Medications: Medications Prior to Admission  Medication Sig Dispense Refill Last Dose  . albuterol (PROVENTIL HFA;VENTOLIN HFA) 108 (90 Base) MCG/ACT inhaler Inhale 1-2 puffs into the lungs every 6 (six) hours as needed for wheezing or shortness of breath. 1 Inhaler 0   . brompheniramine-pseudoephedrine-DM 30-2-10 MG/5ML syrup Take 5 mLs by mouth 4 (four) times daily as needed. (Patient not taking: Reported on 09/01/2019) 120 mL 0   . Cetirizine HCl 10 MG CAPS Take 1 capsule (10 mg total) by mouth daily for 10 days. (Patient not taking: Reported on 09/01/2019) 10 capsule 0   . etonogestrel (NEXPLANON) 68 MG IMPL implant 1 each once by Subdermal route.     . fluticasone (FLONASE) 50 MCG/ACT nasal spray Place 1-2 sprays into both nostrils daily for 7 days. (Patient not taking: Reported on 09/01/2019) 1 g 0   . hydroxypropyl methylcellulose / hypromellose (ISOPTO TEARS / GONIOVISC) 2.5 % ophthalmic solution Place 1 drop into both eyes 3 (three) times daily as needed for dry eyes.     Marland Kitchen. ibuprofen (ADVIL) 200 MG tablet Take 400 mg by mouth every 6 (six) hours as needed for moderate pain.       Musculoskeletal: Strength & Muscle Tone: within normal limits Gait & Station: normal Patient leans: N/A  Psychiatric Specialty Exam: Physical Exam  Nursing note and vitals  reviewed. Constitutional: She appears well-developed and well-nourished.  Cardiovascular: Normal rate and regular rhythm.    Review of Systems  Constitutional: Negative.   Eyes: Negative.   Cardiovascular: Negative.   Gastrointestinal: Negative.   Genitourinary: Negative.   Neurological: Negative.   Endo/Heme/Allergies: Negative.     Last menstrual period 08/18/2019.There is no height or weight on file  to calculate BMI.  General Appearance: Disheveled  Eye Contact:  Minimal  Speech:  Slow  Volume:  Decreased  Mood:  Dysphoric  Affect:  Flat  Thought Process:  Coherent and Descriptions of Associations: Loose  Orientation:  Full (Time, Place, and Person)  Thought Content:  Hallucinations: Auditory Visual and Rumination  Suicidal Thoughts:  Yes.  without intent/plan  Homicidal Thoughts:  No  Memory:  Immediate;   Fair Recent;   Fair Remote;   Fair  Judgement:  Fair  Insight:  Fair  Psychomotor Activity:  Normal  Concentration:  Concentration: Fair and Attention Span: Fair  Recall:  [employer] of Knowledge:  Fair  Language:  Soft-  Akathisia:  Negative  Handed:  Right  AIMS (if indicated):     Assets:  Physical Health Resilience  ADL's:  Intact  Cognition:  WNL  Sleep:       Treatment Plan Summary: Daily contact with patient to assess and evaluate symptoms and progress in treatment  Observation Level/Precautions:  15 minute checks  Laboratory:  UDS  Psychotherapy: Cognitive and reality based  Medications: Begin Risperdal and neuro protection  Consultations: None none necessary  Discharge Concerns: Longer term sobriety and compliance  Estimated LOS: 7-10  Other: Axis I schizoaffective bipolar type with depression and psychosis at the present time/cannabis dependence   Physician Treatment Plan for Primary Diagnosis: Chronic cannabis dependence/schizoaffective bipolar type  Long Term Goal(s): Improvement in symptoms so as ready for discharge  Short Term Goals:  Ability to disclose and discuss suicidal ideas, Ability to demonstrate self-control will improve, Ability to identify and develop effective coping behaviors will improve, Ability to maintain clinical measurements within normal limits will improve, Compliance with prescribed medications will improve and Ability to identify triggers associated with substance abuse/mental health issues will improve  Physician Treatment Plan for Secondary Diagnosis: Active Problems:   MDD (major depressive disorder), single episode, severe (HCC)   Schizoaffective disorder, bipolar type ([place])  Long Term Goal(s): Improvement in symptoms so as ready for discharge  Short Term Goals: Ability to verbalize feelings will improve, Ability to disclose and discuss suicidal ideas, Ability to demonstrate self-control will improve, Ability to identify and develop effective coping behaviors will improve and Ability to maintain clinical measurements within normal limits will improve  I certify that inpatient services furnished can reasonably be expected to improve the patient's condition.    Johnn Hai, MD 10/30/20202:42 PM

## 2019-09-01 NOTE — ED Notes (Signed)
Pt changed into paper scrubs, her belongings are secured in one white pt bag by the nurse's station, and she has been wanded by security.

## 2019-09-01 NOTE — [facility] Group Notes (Signed)
LCSW Group Therapy Note    Date/Time: 09/01/2019 @ 11:30am  Mood: Did not attend   Type of Therapy and Topic: Group Therapy: Avoiding Self-Sabotaging and Enabling Behaviors  Participation Level: Did Not Attend   Description of Group:  In this group, patients will learn how to identify obstacles, self-sabotaging and enabling behaviors, as well as: what are they, why do we do them and what needs these behaviors meet. Discuss unhealthy relationships and how to have positive healthy boundaries with those that sabotage and enable. Explore aspects of self-sabotage and enabling in yourself and how to limit these self-destructive behaviors in everyday life.   Therapeutic Goals: 1. Patient will identify one obstacle that relates to self-sabotage and enabling behaviors 2. Patient will identify one personal self-sabotaging or enabling behavior they did prior to admission 3. Patient will state a plan to change the above identified behavior 4. Patient will demonstrate ability to communicate their needs through discussion and/or role play.    Summary of Patient Progress:   Patient did not attend group therapy today.    Therapeutic Modalities:  Cognitive Behavioral Therapy Person-Centered Therapy Motivational Interviewing    [place], [place]

## 2019-09-01 NOTE — Progress Notes (Signed)
09/01/19  0916  Called transportation [phone] for pt to go to [facility].

## 2019-09-01 NOTE — ED Triage Notes (Addendum)
Pt states hearing voices saying " Kill Her "   "Hurt her" pt states not sure who the voice is referring to so she call [employer] because she didn't want to make any bad decisions. Pt was escorted to the ED voluntarily by [facility] officer.. SI over the last 24 hrs with vague plan to take a bunch of pills. Pt admits to smoking pot in the last several days but hasn't used estacy since March this year states she maybe having withdrawal

## 2019-09-02 DIAGNOSIS — F250 Schizoaffective disorder, bipolar type: Secondary | ICD-10-CM | POA: Diagnosis not present

## 2019-09-02 MED ORDER — RISPERIDONE 3 MG PO TABS
3.0000 mg | ORAL_TABLET | Freq: Two times a day (BID) | ORAL | Status: DC
  Administered 2019-09-02 – 2019-09-04 (×4): 3 mg via ORAL
  Filled 2019-09-02 (×8): qty 1

## 2019-09-02 MED ORDER — BENZTROPINE MESYLATE 1 MG PO TABS
1.0000 mg | ORAL_TABLET | Freq: Once | ORAL | Status: AC
  Administered 2019-09-02: 1 mg via ORAL
  Filled 2019-09-02 (×2): qty 1

## 2019-09-02 NOTE — [facility] Group Notes (Signed)
  [facility] LCSW Group Therapy Note  Date/Time:  09/02/2019 11:15AM-12:00PM  Type of Therapy and Topic:  Group Therapy:  Avoiding Rehospitalization  Participation Level:  Active   Description of Group This process group involved patients discussing their plans related to taking care of themselves at discharge in order to avoid, if at all possible, being rehospitalized in the future.  The group brainstormed specific ways in which they will be able to implement these plans.    Therapeutic Goals 1. Patient will identify and describe 2-3 specific tasks they plan to undertake in order to maximize their wellness and avoid coming back to a hospital. 2. Patient will verbalize benefits of each task described, as well as barriers to implementation. 3. Patients will brainstorm ways to implement their plans.  Summary of Patient Progress:  The patient expressed her plans for self-care at discharge include keeping busy.  The way in which this will help is to keep her mind occupied.  The patient participated fully with an affect that was labile, as she kept breaking into tears.  She spoke a great deal and was very comfortable throughout group, although she referenced her anxiety, specifically social anxiety, numerous times.  Therapeutic Modalities Stages of Change theory Motivational Interviewing    Selmer Dominion, LCSW 09/02/2019, 9:02 AM

## 2019-09-02 NOTE — Progress Notes (Signed)
Patient ID: Desiree Berger, female   DOB: 1996/02/15, 23 y.o.   MRN: [id number]  [place] NOVEL CORONAVIRUS (COVID-19) DAILY CHECK-OFF SYMPTOMS - answer yes or no to each - every day NO YES  Have you had a fever in the past 24 hours?  . Fever (Temp > 37.80C / 100F) X   Have you had any of these symptoms in the past 24 hours? . New Cough .  Sore Throat  .  Shortness of Breath .  Difficulty Breathing .  Unexplained Body Aches   X   Have you had any one of these symptoms in the past 24 hours not related to allergies?   . Runny Nose .  Nasal Congestion .  Sneezing   X   If you have had runny nose, nasal congestion, sneezing in the past 24 hours, has it worsened?  X   EXPOSURES - check yes or no X   Have you traveled outside the state in the past 14 days?  X   Have you been in contact with someone with a confirmed diagnosis of COVID-19 or PUI in the past 14 days without wearing appropriate PPE?  X   Have you been living in the same home as a person with confirmed diagnosis of COVID-19 or a PUI (household contact)?    X   Have you been diagnosed with COVID-19?    X              What to do next: Answered NO to all: Answered YES to anything:   Proceed with unit schedule Follow the [facility] Inpatient Flowsheet.

## 2019-09-02 NOTE — Progress Notes (Signed)
   09/02/19 0000  Psych Admission Type (Psych Patients Only)  Admission Status Voluntary  Psychosocial Assessment  Patient Complaints Crying spells  Eye Contact Fair  Facial Expression Anxious;Sad  Affect Anxious;Sad  Speech Soft  Interaction Submissive  Motor Activity Slow  Appearance/Hygiene In scrubs  Behavior Characteristics Cooperative  Mood Anxious;Depressed  Thought Process  Coherency WDL  Content WDL  Delusions WDL  Perception WDL  Hallucination Command;Auditory  Judgment Impaired  Confusion Mild  Danger to Self  Current suicidal ideation? Denies  Danger to Others  Danger to Others None reported or observed   Pt been in room majority of the evening.

## 2019-09-02 NOTE — Plan of Care (Signed)
  Problem: Education: Goal: Ability to state activities that reduce stress will improve Outcome: Progressing   Problem: Coping: Goal: Will verbalize feelings Outcome: Progressing   D: Pt alert and oriented on the unit. Pt engaging with RN staff and other pts. Pt is pleasant and cooperative. A: Education, support and encouragement provided, q15 minute safety checks remain in effect. Medications administered per MD orders. R: No reactions/side effects to medicine noted. Pt denies any concerns at this time, and verbally contracts for safety. Pt ambulating on the unit with no issues. Pt remains safe on and off the unit.
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
## 2019-09-03 DIAGNOSIS — F322 Major depressive disorder, single episode, severe without psychotic features: Secondary | ICD-10-CM

## 2019-09-03 NOTE — Progress Notes (Signed)
Patient ID: Mackie Michelle Balfour, female   DOB: 04/15/1996, 23 y.o.   MRN: [id number]  [place] NOVEL CORONAVIRUS (COVID-19) DAILY CHECK-OFF SYMPTOMS - answer yes or no to each - every day NO YES  Have you had a fever in the past 24 hours?  . Fever (Temp > 37.80C / 100F) X   Have you had any of these symptoms in the past 24 hours? . New Cough .  Sore Throat  .  Shortness of Breath .  Difficulty Breathing .  Unexplained Body Aches   X   Have you had any one of these symptoms in the past 24 hours not related to allergies?   . Runny Nose .  Nasal Congestion .  Sneezing   X   If you have had runny nose, nasal congestion, sneezing in the past 24 hours, has it worsened?  X   EXPOSURES - check yes or no X   Have you traveled outside the state in the past 14 days?  X   Have you been in contact with someone with a confirmed diagnosis of COVID-19 or PUI in the past 14 days without wearing appropriate PPE?  X   Have you been living in the same home as a person with confirmed diagnosis of COVID-19 or a PUI (household contact)?    X   Have you been diagnosed with COVID-19?    X              What to do next: Answered NO to all: Answered YES to anything:   Proceed with unit schedule Follow the [facility] Inpatient Flowsheet.    

## 2019-09-03 NOTE — Plan of Care (Signed)
  Problem: Coping: Goal: Coping ability will improve Outcome: Progressing   Problem: Education: Goal: Mental status will improve Outcome: Progressing

## 2019-09-03 NOTE — [facility] Group Notes (Signed)
[facility] LCSW Group Therapy Note  Date/Time:  09/03/2019  11:00AM-12:00PM  Type of Therapy and Topic:  Group Therapy:  Music and Mood  Participation Level:  Did Not Attend   Description of Group: In this process group, members listened to a variety of genres of music and identified that different types of music evoke different responses.  Patients were encouraged to identify music that was soothing for them and music that was energizing for them.  Patients discussed how this knowledge can help with wellness and recovery in various ways including managing depression and anxiety as well as encouraging healthy sleep habits.    Therapeutic Goals: 1. Patients will explore the impact of different varieties of music on mood 2. Patients will verbalize the thoughts they have when listening to different types of music 3. Patients will identify music that is soothing to them as well as music that is energizing to them 4. Patients will discuss how to use this knowledge to assist in maintaining wellness and recovery 5. Patients will explore the use of music as a coping skill  Summary of Patient Progress:  Patient was invited to attend group, chose not to do so.  Therapeutic Modalities: Solution Focused Brief Therapy Activity   Selmer Dominion, LCSW

## 2019-09-03 NOTE — Progress Notes (Addendum)
[facility] MD Progress Note  09/03/2019 8:35 AM Desiree Berger  MRN:  [id number]  Subjective:  Desiree Berger " feeling a lot better today"  Evaluation:  Desiree Berger observed sitting in bed room interacting with roommate.  She is awake, alert and oriented x3.  Presents with the bright and pleasant affect.  Denying suicidal or homicidal ideations.  "  No longer wanting hurt my dad, I am not mad at him anymore."  denies auditory or visual hallucinations.  Continues to report paranoia ideation.  Reports feeling overwhelmed and guilty at times.  Did not elaborate with thoughts or feelings.  Desiree Berger reports she was unable to speak with her mother or father on yesterday however her grandmother brought her son close by and she was able to speak with her.  Patient reports missing her family.  She reports a good appetite.  States she is resting well throughout the night.  Support, encouragement and  reassurance was provided  Principal Problem: Schizoaffective disorder, bipolar type ([place]) Diagnosis: Principal Problem:   Schizoaffective disorder, bipolar type ([place]) Active Problems:   MDD (major depressive disorder), single episode, severe ([place])  Total Time spent with patient: 15 minutes  Past Psychiatric History:   Past Medical History:  Past Medical History:  Diagnosis Date  . Asthma   . Seasonal allergies     Past Surgical History:  Procedure Laterality Date  . ADENOIDECTOMY    . TONSILLECTOMY     Family History:  Family History  Problem Relation Age of Onset  . Hypertension Mother   . Mental illness Mother   . Hyperlipidemia Father   . Birth defects Maternal Grandmother   . Heart attack Neg Hx   . Diabetes Neg Hx   . Sudden death Neg Hx    Family Psychiatric  History:  Social History:  Social History   Substance and Sexual Activity  Alcohol Use No     Social History   Substance and Sexual Activity  Drug Use Yes  . Types: Cocaine, Marijuana   Comment: Stated cocaine ingestion  x 2 hours.    Social History   Socioeconomic History  . Marital status: Single    Spouse name: Not on file  . Number of children: Not on file  . Years of education: Not on file  . Highest education level: Not on file  Occupational History  . Not on file  Social Needs  . Financial resource strain: Not on file  . Food insecurity    Worry: Not on file    Inability: Not on file  . Transportation needs    Medical: Not on file    Non-medical: Not on file  Tobacco Use  . Smoking status: Current Every Day Smoker    Packs/day: 1.00  . Smokeless tobacco: Never Used  . Tobacco comment: Patient refused  Substance and Sexual Activity  . Alcohol use: No  . Drug use: Yes    Types: Cocaine, Marijuana    Comment: Stated cocaine ingestion x 2 hours.  . Sexual activity: Not Currently    Birth control/protection: Abstinence  Lifestyle  . Physical activity    Days per week: Not on file    Minutes per session: Not on file  . Stress: Not on file  Relationships  . Social Herbalist on phone: Not on file    Gets together: Not on file    Attends religious service: Not on file    Active member of club or organization: Not on  file    Attends meetings of clubs or organizations: Not on file    Relationship status: Not on file  Other Topics Concern  . Not on file  Social History Narrative  . Not on file   Additional Social History:                         Sleep: Fair  Appetite:  Fair  Current Medications: Current Facility-Administered Medications  Medication Dose Route Frequency Provider Last Rate Last Dose  . acetaminophen (TYLENOL) tablet 650 mg  650 mg Oral Q6H PRN Malvin Johns, MD      . albuterol (VENTOLIN HFA) 108 (90 Base) MCG/ACT inhaler 1-2 puff  1-2 puff Inhalation Q6H PRN Malvin Johns, MD      . alum & mag hydroxide-simeth (MAALOX/MYLANTA) 200-200-20 MG/5ML suspension 30 mL  30 mL Oral Q4H PRN Malvin Johns, MD      . benztropine (COGENTIN) tablet 0.5 mg   0.5 mg Oral BID Malvin Johns, MD   0.5 mg at 09/03/19 0756  . carbamazepine (TEGRETOL) chewable tablet 100 mg  100 mg Oral TID Malvin Johns, MD   100 mg at 09/03/19 0756  . feeding supplement (ENSURE ENLIVE) (ENSURE ENLIVE) liquid 237 mL  237 mL Oral BID BM Malvin Johns, MD   237 mL at 09/02/19 1718  . fluticasone (FLONASE) 50 MCG/ACT nasal spray 2 spray  2 spray Each Nare BID PRN Malvin Johns, MD      . LORazepam (ATIVAN) injection 2 mg  2 mg Intravenous Q6H PRN Malvin Johns, MD      . magnesium hydroxide (MILK OF MAGNESIA) suspension 30 mL  30 mL Oral Daily PRN Malvin Johns, MD      . risperiDONE (RISPERDAL) tablet 3 mg  3 mg Oral BID Oneta Rack, NP   3 mg at 09/03/19 0756  . temazepam (RESTORIL) capsule 30 mg  30 mg Oral QHS Malvin Johns, MD      . traZODone (DESYREL) tablet 150 mg  150 mg Oral QHS PRN Malvin Johns, MD        Lab Results:  No results found for this or any previous visit (from the past 48 hour(s)).  Blood Alcohol level:  Lab Results  Component Value Date   ETH <10 09/01/2019   ETH <10 09/07/2017    Metabolic Disorder Labs: Lab Results  Component Value Date   HGBA1C 5.0 09/10/2017   MPG 96.8 09/10/2017   Lab Results  Component Value Date   PROLACTIN 108.7 (H) 09/10/2017   PROLACTIN 62.8 01/16/2014   Lab Results  Component Value Date   CHOL 145 09/09/2017   TRIG 122 09/09/2017   HDL 39 (L) 09/09/2017   CHOLHDL 3.7 09/09/2017   VLDL 24 09/09/2017   LDLCALC 82 09/09/2017   LDLCALC 81 01/16/2014    Physical Findings: AIMS:  , ,  ,  ,    CIWA:    COWS:     Musculoskeletal: Strength & Muscle Tone: within normal limits Gait & Station: normal Patient leans: N/A  Psychiatric Specialty Exam: Physical Exam  Vitals reviewed. Constitutional: She appears well-developed.  HENT:  Head: Normocephalic.  Cardiovascular: Normal rate.  Psychiatric: She has a normal mood and affect. Her behavior is normal.    Review of Systems  Psychiatric/Behavioral:  Positive for depression. Negative for suicidal ideas. The patient is nervous/anxious.   All other systems reviewed and are negative.   Blood pressure 137/87, pulse 72, temperature  98.7 F (37.1 C), temperature source Oral, resp. rate 18, height 5\' 6"  (1.676 m), weight 80.9 kg, last menstrual period 08/18/2019, SpO2 98 %.Body mass index is 28.79 kg/m.  General Appearance: Casual  Eye Contact:  Fair  Speech:  Clear and Coherent  Volume:  Normal  Mood:  Anxious  Affect:  Congruent  Thought Process:  Coherent  Orientation:  Full (Time, Place, and Person)  Thought Content:  Hallucinations: None  Suicidal Thoughts:  No  Homicidal Thoughts:  No  Memory:  Immediate;   Fair Recent;   Fair  Judgement:  Fair  Insight:  Fair  Psychomotor Activity:  Normal  Concentration:  Concentration: Fair  Recall:  Fair  Fund of Knowledge:  Poor  Language:  Fair  Akathisia:  No  Handed:  Right  AIMS (if indicated):     Assets:  Communication Skills Desire for Improvement Resilience Transportation  ADL's:  Intact  Cognition:  WNL  Sleep:  Number of Hours: 7.25     Treatment Plan Summary: Daily contact with patient to assess and evaluate symptoms and progress in treatment and Medication management   Continue with current treatment plan on 09/03/2019 as listed below except where noted  Continue Risperdal 3 mg p.o. twice daily Continue Tegretol 100 mg p.o. 3 times daily Continue Cogentin 0.5mg  p.o. twice daily  CSW to continue working on discharge disposition Patient encouraged to participate  with daily group sessions    Oneta Rackanika N Lewis, NP 09/03/2019, 8:35 AM Attest to NP progress note

## 2019-09-03 NOTE — Progress Notes (Signed)
Nutrition Brief Note  Patient identified on the Malnutrition Screening Tool (MST) Report  Patient with stable weight. Pt reports good appetite.  Wt Readings from Last 15 Encounters:  09/01/19 80.9 kg  09/08/17 81.2 kg (95 %, Z= 1.61)*  09/07/17 81.6 kg (95 %, Z= 1.63)*  01/20/14 85.9 kg (98 %, Z= 1.99)*  01/15/14 85 kg (97 %, Z= 1.96)*  10/30/13 81.2 kg (97 %, Z= 1.84)*  04/28/12 83.5 kg (99 %, Z= 2.17)*  04/14/12 83.5 kg (99 %, Z= 2.18)*  04/11/12 83.5 kg (99 %, Z= 2.18)*   * Growth percentiles are based on CDC (Girls, 2-20 Years) data.    Body mass index is 28.79 kg/m. Patient meets criteria for overweight based on current BMI.  Labs and medications reviewed.   No nutrition interventions warranted at this time. If nutrition issues arise, please consult RD.   Clayton Bibles, MS, RD, LDN Inpatient Clinical Dietitian Pager: [phone] After Hours Pager: [phone]
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
## 2019-09-04 MED ORDER — CARBAMAZEPINE 100 MG PO CHEW: CHEWABLE_TABLET | ORAL | 0 refills | Status: DC

## 2019-09-04 MED ORDER — BENZTROPINE MESYLATE 0.5 MG PO TABS: 0.5000 mg | ORAL_TABLET | Freq: Two times a day (BID) | ORAL | 11 refills | Status: DC

## 2019-09-04 MED ORDER — TRAZODONE HCL 150 MG PO TABS: 150.0000 mg | ORAL_TABLET | Freq: Every evening | ORAL | 1 refills | Status: DC | PRN

## 2019-09-04 MED ORDER — RISPERIDONE 3 MG PO TABS: 6.0000 mg | ORAL_TABLET | Freq: Every day | ORAL | 1 refills | Status: DC

## 2019-09-04 MED ORDER — HYDROXYZINE HCL 50 MG PO TABS
50.0000 mg | ORAL_TABLET | Freq: Three times a day (TID) | ORAL | Status: DC | PRN
  Administered 2019-09-04: 50 mg via ORAL
  Filled 2019-09-04: qty 1

## 2019-09-04 NOTE — Tx Team (Signed)
Interdisciplinary Treatment and Diagnostic Plan Update  09/04/2019 Time of Session: 10:50am Desiree Berger MRN: [id number]  Principal Diagnosis: Schizoaffective disorder, bipolar type [facility](HCC)  Secondary Diagnoses: Principal Problem:   Schizoaffective disorder, bipolar type (HCC) Active Problems:   MDD (major depressive disorder), single episode, severe (HCC)   Current Medications:  Current Facility-Administered Medications  Medication Dose Route Frequency Provider Last Rate Last Dose  . acetaminophen (TYLENOL) tablet 650 mg  650 mg Oral Q6H PRN Desiree JohnsFarah, Brian, MD      . albuterol (VENTOLIN HFA) 108 (90 Base) MCG/ACT inhaler 1-2 puff  1-2 puff Inhalation Q6H PRN Desiree JohnsFarah, Brian, MD      . alum & mag hydroxide-simeth (MAALOX/MYLANTA) 200-200-20 MG/5ML suspension 30 mL  30 mL Oral Q4H PRN Desiree JohnsFarah, Brian, MD      . benztropine (COGENTIN) tablet 0.5 mg  0.5 mg Oral BID Desiree JohnsFarah, Brian, MD   0.5 mg at 09/04/19 [id number]  . carbamazepine (TEGRETOL) chewable tablet 100 mg  100 mg Oral TID Desiree JohnsFarah, Brian, MD   100 mg at 09/04/19 1138  . feeding supplement (ENSURE ENLIVE) (ENSURE ENLIVE) liquid 237 mL  237 mL Oral BID BM Desiree JohnsFarah, Brian, MD   237 mL at 09/04/19 0921  . fluticasone (FLONASE) 50 MCG/ACT nasal spray 2 spray  2 spray Each Nare BID PRN Desiree JohnsFarah, Brian, MD      . hydrOXYzine (ATARAX/VISTARIL) tablet 50 mg  50 mg Oral TID PRN Desiree JohnsFarah, Brian, MD   50 mg at 09/04/19 1138  . LORazepam (ATIVAN) injection 2 mg  2 mg Intravenous Q6H PRN Desiree JohnsFarah, Brian, MD      . magnesium hydroxide (MILK OF MAGNESIA) suspension 30 mL  30 mL Oral Daily PRN Desiree JohnsFarah, Brian, MD      . risperiDONE (RISPERDAL) tablet 3 mg  3 mg Oral BID Oneta Berger, Desiree N, NP   3 mg at 09/04/19 [id number]  . temazepam (RESTORIL) capsule 30 mg  30 mg Oral QHS Desiree JohnsFarah, Brian, MD      . traZODone (DESYREL) tablet 150 mg  150 mg Oral QHS PRN Desiree JohnsFarah, Brian, MD       PTA Medications: Medications Prior to Admission  Medication Sig Dispense Refill Last Dose  .  albuterol (PROVENTIL HFA;VENTOLIN HFA) 108 (90 Base) MCG/ACT inhaler Inhale 1-2 puffs into the lungs every 6 (six) hours as needed for wheezing or shortness of breath. 1 Inhaler 0   . brompheniramine-pseudoephedrine-DM 30-2-10 MG/5ML syrup Take 5 mLs by mouth 4 (four) times daily as needed. (Patient not taking: Reported on 09/01/2019) 120 mL 0   . Cetirizine HCl 10 MG CAPS Take 1 capsule (10 mg total) by mouth daily for 10 days. (Patient not taking: Reported on 09/01/2019) 10 capsule 0   . etonogestrel (NEXPLANON) 68 MG IMPL implant 1 each once by Subdermal route.     . fluticasone (FLONASE) 50 MCG/ACT nasal spray Place 1-2 sprays into both nostrils daily for 7 days. (Patient not taking: Reported on 09/01/2019) 1 g 0   . hydroxypropyl methylcellulose / hypromellose (ISOPTO TEARS / GONIOVISC) 2.5 % ophthalmic solution Place 1 drop into both eyes 3 (three) times daily as needed for dry eyes.     Marland Kitchen. ibuprofen (ADVIL) 200 MG tablet Take 400 mg by mouth every 6 (six) hours as needed for moderate pain.       Patient Stressors: Loss of support system related to family conflicts Other: Disturbed thought process  Patient Strengths: Ability for insight Communication skills Motivation for treatment/growth Work skills  Treatment Modalities:  Medication Management, Group therapy, Case management,  1 to 1 session with clinician, Psychoeducation, Recreational therapy.   Physician Treatment Plan for Primary Diagnosis: Schizoaffective disorder, bipolar type (HCC) Long Term Goal(s): Improvement in symptoms so as ready for discharge Improvement in symptoms so as ready for discharge   Short Term Goals: Ability to disclose and discuss suicidal ideas Ability to demonstrate self-control will improve Ability to identify and develop effective coping behaviors will improve Ability to maintain clinical measurements within normal limits will improve Compliance with prescribed medications will improve Ability to  identify triggers associated with substance abuse/mental health issues will improve Ability to verbalize feelings will improve Ability to disclose and discuss suicidal ideas Ability to demonstrate self-control will improve Ability to identify and develop effective coping behaviors will improve Ability to maintain clinical measurements within normal limits will improve  Medication Management: Evaluate patient's response, side effects, and tolerance of medication regimen.  Therapeutic Interventions: 1 to 1 sessions, Unit Group sessions and Medication administration.  Evaluation of Outcomes: Adequate for Discharge  Physician Treatment Plan for Secondary Diagnosis: Principal Problem:   Schizoaffective disorder, bipolar type (HCC) Active Problems:   MDD (major depressive disorder), single episode, severe (HCC)  Long Term Goal(s): Improvement in symptoms so as ready for discharge Improvement in symptoms so as ready for discharge   Short Term Goals: Ability to disclose and discuss suicidal ideas Ability to demonstrate self-control will improve Ability to identify and develop effective coping behaviors will improve Ability to maintain clinical measurements within normal limits will improve Compliance with prescribed medications will improve Ability to identify triggers associated with substance abuse/mental health issues will improve Ability to verbalize feelings will improve Ability to disclose and discuss suicidal ideas Ability to demonstrate self-control will improve Ability to identify and develop effective coping behaviors will improve Ability to maintain clinical measurements within normal limits will improve     Medication Management: Evaluate patient's response, side effects, and tolerance of medication regimen.  Therapeutic Interventions: 1 to 1 sessions, Unit Group sessions and Medication administration.  Evaluation of Outcomes: Adequate for Discharge   RN Treatment Plan for  Primary Diagnosis: Schizoaffective disorder, bipolar type (HCC) Long Term Goal(s): Knowledge of disease and therapeutic regimen to maintain health will improve  Short Term Goals: Ability to participate in decision making will improve, Ability to verbalize feelings will improve, Ability to disclose and discuss suicidal ideas, Ability to identify and develop effective coping behaviors will improve and Compliance with prescribed medications will improve  Medication Management: RN will administer medications as ordered by provider, will assess and evaluate patient's response and provide education to patient for prescribed medication. RN will report any adverse and/or side effects to prescribing provider.  Therapeutic Interventions: 1 on 1 counseling sessions, Psychoeducation, Medication administration, Evaluate responses to treatment, Monitor vital signs and CBGs as ordered, Perform/monitor CIWA, COWS, AIMS and Fall Risk screenings as ordered, Perform wound care treatments as ordered.  Evaluation of Outcomes: Adequate for Discharge   LCSW Treatment Plan for Primary Diagnosis: Schizoaffective disorder, bipolar type (HCC) Long Term Goal(s): Safe transition to appropriate next level of care at discharge, Engage patient in therapeutic group addressing interpersonal concerns.  Short Term Goals: Engage patient in aftercare planning with referrals and resources and Increase skills for wellness and recovery  Therapeutic Interventions: Assess for all discharge needs, 1 to 1 time with Social worker, Explore available resources and support systems, Assess for adequacy in community support network, Educate family and significant other(s) on suicide prevention, Complete Psychosocial Assessment,  Interpersonal group therapy.  Evaluation of Outcomes: Adequate for Discharge   Progress in Treatment: Attending groups: No. Participating in groups: No. Taking medication as prescribed: Yes. Toleration medication:  Yes. Family/Significant other contact made: Yes, individual(s) contacted:  pt's mother Patient understands diagnosis: Yes. Discussing patient identified problems/goals with staff: Yes. Medical problems stabilized or resolved: Yes. Denies suicidal/homicidal ideation: Yes. Issues/concerns per patient self-inventory: No. Other:   New problem(s) identified: No, Describe:  None  New Short Term/Long Term Goal(s): Medication stabilization, elimination of SI thoughts, and development of a comprehensive mental wellness plan.   Patient Goals:  "No more voices"  Discharge Plan or Barriers: Patient will discharge home with her mother and continue therapy with her therapist with [facility] and medication management with [facility].   Reason for Continuation of Hospitalization: Patient is discharging today.   Estimated Length of Stay: Patient is discharging today.   Attendees: Patient: Desiree Berger   09/04/2019   Physician: Dr. Johnn Hai, MD 09/04/2019   Nursing: Benjamine Mola, RN 09/04/2019  RN Care Manager: 09/04/2019  Social Worker: Ardelle Anton, LCSW 09/04/2019   Recreational Therapist:  09/04/2019  Other:  09/04/2019  Other:  09/04/2019   Other: 09/04/2019      Scribe for Treatment Team: Trecia Rogers, LCSW 09/04/2019 2:53 PM

## 2019-09-04 NOTE — Discharge Summary (Signed)
Physician Discharge Summary Note  Patient:  Desiree Berger is an 23 y.o., female MRN:  [id number] DOB:  [birth year] Patient phone:  [phone] (home)  Patient address:   [address] [place] [id number],  Total Time spent with patient: 15 minutes  Date of Admission:  09/01/2019 Date of Discharge: 09/04/19  Reason for Admission:  Suicidal ideation  Principal Problem: Schizoaffective disorder, bipolar type [facility](HCC) Discharge Diagnoses: Principal Problem:   Schizoaffective disorder, bipolar type (HCC) Active Problems:   MDD (major depressive disorder), single episode, severe (HCC)   Past Psychiatric History: History of cannabis dependence. Two prior hospitalizations, most recently in 2018 for psychosis with substance use. Patient reports history of multiple suicide attempts.  Past Medical History:  Past Medical History:  Diagnosis Date  . Asthma   . Seasonal allergies     Past Surgical History:  Procedure Laterality Date  . ADENOIDECTOMY    . TONSILLECTOMY     Family History:  Family History  Problem Relation Age of Onset  . Hypertension Mother   . Mental illness Mother   . Hyperlipidemia Father   . Birth defects Maternal Grandmother   . Heart attack Neg Hx   . Diabetes Neg Hx   . Sudden death Neg Hx    Family Psychiatric  History: Denies Social History:  Social History   Substance and Sexual Activity  Alcohol Use No     Social History   Substance and Sexual Activity  Drug Use Yes  . Types: Cocaine, Marijuana   Comment: Stated cocaine ingestion x 2 hours.    Social History   Socioeconomic History  . Marital status: Single    Spouse name: Not on file  . Number of children: Not on file  . Years of education: Not on file  . Highest education level: Not on file  Occupational History  . Not on file  Social Needs  . Financial resource strain: Not on file  . Food insecurity    Worry: Not on file    Inability: Not on file  .  Transportation needs    Medical: Not on file    Non-medical: Not on file  Tobacco Use  . Smoking status: Current Every Day Smoker    Packs/day: 1.00  . Smokeless tobacco: Never Used  . Tobacco comment: Patient refused  Substance and Sexual Activity  . Alcohol use: No  . Drug use: Yes    Types: Cocaine, Marijuana    Comment: Stated cocaine ingestion x 2 hours.  . Sexual activity: Not Currently    Birth control/protection: Abstinence  Lifestyle  . Physical activity    Days per week: Not on file    Minutes per session: Not on file  . Stress: Not on file  Relationships  . Social Musicianconnections    Talks on phone: Not on file    Gets together: Not on file    Attends religious service: Not on file    Active member of club or organization: Not on file    Attends meetings of clubs or organizations: Not on file    Relationship status: Not on file  Other Topics Concern  . Not on file  Social History Narrative  . Not on file    Hospital Course:  From admission H&P: This is the third psychiatric admission here and overall for [name], she is [age], her first admission was at age [age] after an overdose on ibuprofen, her second admission was in 2018 with a  diagnosis of psychosis involving hallucinations/delusions/and substance abuse.  She is chronically dependent upon cannabis. She states she is here this time because she phone police, was having "suicidal and homicidal thoughts" elaborates that she is "attempted suicide many times" and has "lost count" according to past notes.  Again she is cannabis dependent and acknowledges that "sobriety" from cannabis will cure most of her problems. But more recently she is not taking any psychotropic medications and she reports auditory hallucinations that sound like they are "inside" her head telling her to harm her self and further, though she tells me she does not recognize the voice she described to another clinician is a woman who "sold her cocaine" She  reports visual hallucinations that she describes "scary" but when she elaborated she said they are visual floaters and colored dots. Current mental status exam-alert oriented to person place time day and situation and knows the holiday that is coming up so forth is conversant makes fair eye contact speech is soft and slow acknowledges feeling somewhat depressed reports intermittent episodes of feeling hyperactive without racing thoughts but states she will go without sleep and will eventually "go to sleep" after 4 to 5 days of this feeling.  She denies wanting to harm others.  She denies wanting to harm herself while she is here and understands what it is to contract for safety.  Ms. Crumble was admitted for reports of auditory hallucinations, suicidal ideation and homicidal ideation. UDS +THC. She remained on the [facility] unit for three days. She was started on Risperdal, Cogentin, Tegretol, and trazodone. She responded well to treatment with no adverse effects reported. She has shown improved mood, affect, sleep, and interaction. She denies any SI/HI/AVH and contracts for safety. She is discharging on the medications listed below. She agrees to follow up at [facility] [employer] (see below). She is provided with prescriptions for medications upon discharge. Her mother is picking her up for discharge home.  Physical Findings: AIMS:  , ,  ,  ,    CIWA:    COWS:     Musculoskeletal: Strength & Muscle Tone: within normal limits Gait & Station: normal Patient leans: N/A  Psychiatric Specialty Exam: Physical Exam  Nursing note and vitals reviewed. Constitutional: She is oriented to person, place, and time. She appears well-developed and well-nourished.  Cardiovascular: Normal rate.  Respiratory: Effort normal.  Neurological: She is alert and oriented to person, place, and time.    Review of Systems  Constitutional: Negative.   Respiratory: Negative for cough and  shortness of breath.   Cardiovascular: Negative for chest pain.  Psychiatric/Behavioral: Positive for substance abuse. Negative for depression, hallucinations and suicidal ideas. The patient is not nervous/anxious and does not have insomnia.     Blood pressure 132/83, pulse 67, temperature 98.7 F (37.1 C), temperature source Oral, resp. rate 18, height  (1.676 m), weight 80.9 kg, last menstrual period 08/18/2019, SpO2 98 %.Body mass index is 28.79 kg/m.  See MD's discharge SRA     Have you used any form of tobacco in the last 30 days? (Cigarettes, Smokeless Tobacco, Cigars, and/or Pipes): Yes  Has this patient used any form of tobacco in the last 30 days? (Cigarettes, Smokeless Tobacco, Cigars, and/or Pipes)  No  Blood Alcohol level:  Lab Results  Component Value Date   [facility] <10 09/01/2019   ETH <10 09/07/2017    Metabolic Disorder Labs:  Lab Results  Component Value Date   HGBA1C 5.0  09/10/2017   MPG 96.8 09/10/2017   Lab Results  Component Value Date   PROLACTIN 108.7 (H) 09/10/2017   PROLACTIN 62.8 01/16/2014   Lab Results  Component Value Date   CHOL 145 09/09/2017   TRIG 122 09/09/2017   HDL 39 (L) 09/09/2017   CHOLHDL 3.7 09/09/2017   VLDL 24 09/09/2017   LDLCALC 82 09/09/2017   LDLCALC 81 01/16/2014    See Psychiatric Specialty Exam and Suicide Risk Assessment completed by Attending Physician prior to discharge.  Discharge destination:  Home  Is patient on multiple antipsychotic therapies at discharge:  No   Has Patient had three or more failed trials of antipsychotic monotherapy by history:  No  Recommended Plan for Multiple Antipsychotic Therapies: NA   Allergies as of 09/04/2019   No Known Allergies     Medication List    TAKE these medications     Indication  albuterol 108 (90 Base) MCG/ACT inhaler Commonly known as: VENTOLIN HFA Inhale 1-2 puffs into the lungs every 6 (six) hours as needed for wheezing or shortness of breath.   Indication: Asthma   benztropine 0.5 MG tablet Commonly known as: COGENTIN Take 1 tablet (0.5 mg total) by mouth 2 (two) times daily.  Indication: Extrapyramidal Reaction caused by Medications   brompheniramine-pseudoephedrine-DM 30-2-10 MG/5ML syrup Take 5 mLs by mouth 4 (four) times daily as needed.  Indication: Stuffy Nose   carbamazepine 100 MG chewable tablet Commonly known as: TEGRETOL 1 in am 2 st hs  Indication: Manic-Depression   Cetirizine HCl 10 MG Caps Take 1 capsule (10 mg total) by mouth daily for 10 days.  Indication: Allergic Conjunctivitis   etonogestrel 68 MG Impl implant Commonly known as: NEXPLANON 1 each once by Subdermal route.  Indication: Birth Control Treatment   fluticasone 50 MCG/ACT nasal spray Commonly known as: FLONASE Place 1-2 sprays into both nostrils daily for 7 days.  Indication: Signs and Symptoms of Nose Diseases   hydroxypropyl methylcellulose / hypromellose 2.5 % ophthalmic solution Commonly known as: ISOPTO TEARS / GONIOVISC Place 1 drop into both eyes 3 (three) times daily as needed for dry eyes.  Indication: Dry Nose   ibuprofen 200 MG tablet Commonly known as: ADVIL Take 400 mg by mouth every 6 (six) hours as needed for moderate pain.  Indication: Headache   risperiDONE 3 MG tablet Commonly known as: RISPERDAL Take 2 tablets (6 mg total) by mouth at bedtime.  Indication: Manic Phase of Manic-Depression   traZODone 150 MG tablet Commonly known as: DESYREL Take 1 tablet (150 mg total) by mouth at bedtime as needed for sleep.  Indication: Trouble Sleeping      Follow-up Information    [facility] Follow up on 09/14/2019.   Why: Therapy with [place] [facility] is Thursday, 11/12 at 12:00p. Appt will be virtual.  Contact information: [address] [place] [address] [phone]        [facility] Follow up on 09/21/2019.   Specialty: Behavioral Health Why:  Medication management with Dr. Lolly Mustache is Thursday, 11/19 at 9:00a.  Appt will be virtual. You will recieve an email with the appt information.  Contact information: [address] [place] [address] [phone]          Follow-up recommendations: Activity as tolerated. Diet as recommended by primary care physician. Keep all scheduled follow-up appointments as recommended.   Comments:   Patient is instructed to take all prescribed medications as recommended.  Report any side effects or adverse reactions to your outpatient psychiatrist. Patient is instructed to abstain from alcohol and illegal drugs while on prescription medications. In the event of worsening symptoms, patient is instructed to call the crisis hotline, 911, or go to the nearest emergency department for evaluation and treatment.  Signed: Connye Burkitt, NP 09/04/2019, 3:47 PM

## 2019-09-04 NOTE — Progress Notes (Signed)
  [facility] Adult Case Management Discharge Plan :  Will you be returning to the same living situation after discharge:  Yes,  home with mom At discharge, do you have transportation home?: Yes,  pt's mother Do you have the ability to pay for your medications: Yes,  private insurance  Release of information consent forms completed and in the chart;  Patient's signature needed at discharge.  Patient to Follow up at: Follow-up Information    [facility] Follow up on 09/14/2019.   Why: Therapy with Lorna Few is Thursday, 11/12 at 12:00p. Appt will be virtual.  Contact information: [address] [place] [address] [facility] Follow up on 09/21/2019.   Specialty: Behavioral Health Why: Medication management with Dr. Adele Schilder is Thursday, 11/19 at 9:00a.  Appt will be virtual. You will recieve an email with the appt information.  Contact information: [place] [phone]          Next level of care provider has access to [place] and Suicide Prevention discussed: Yes,  pt's mother  Have you used any form of tobacco in the last 30 days? (Cigarettes, Smokeless Tobacco, Cigars, and/or Pipes): Yes  Has patient been referred to the Quitline?: Patient refused referral  Patient has been referred for addiction treatment: Yes  LYNNZIE BLACKSON, LCSW 09/04/2019, 9:53 AM

## 2019-09-04 NOTE — Progress Notes (Signed)
D/C Note:  RN met with pt to discuss pt's discharge.  RN provided pt with education re: pt's f/u appointments, prescription medications,and discharge instructions.  Pt verbalized understanding and did not have any questions or concerns.  RN provided pt with pt's belongings from locker #26.  Pt denied SI,HI and/or AVH.  Pt stated that her goal is to go to NA/AA meetings and to get a new sponsor so that pt can remain sober.

## 2019-09-04 NOTE — Progress Notes (Signed)
Recreation Therapy Notes  Date: 11.2.20 Time: 1000 Location: [address] Dayroom  Group Topic: Goal Setting  Goal Area(s) Addresses:  Patient will be able to identify at least 3 life goals.  Patient will be able to identify benefit of investing in life goals.  Patient will be able to identify benefit of setting life goals.   Intervention: Worksheet, pencils  Activity: Goal Planning.  Patients were to identify goals they want to accomplish in the next week, month, year and 5 years.  Patients were to then identify obstacles to reaching goals, what would be needed to reach those goals and what they can start doing now to work towards goals.  Education:  Discharge Planning, Radiographer, therapeutic, Leisure Education   Education Outcome: Acknowledges Education/In Group Clarification Provided/Needs Additional Education  Clinical Observations: Pt did not attend group.    Victorino Sparrow, LRT/CTRS     Victorino Sparrow A 09/04/2019 11:50 AM
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
## 2019-09-04 NOTE — [facility] Suicide Risk Assessment (Signed)
[facility] Discharge Suicide Risk Assessment   Principal Problem: Schizoaffective disorder, bipolar type [facility]) Discharge Diagnoses: Principal Problem:   Schizoaffective disorder, bipolar type ([place]) Active Problems:   MDD (major depressive disorder), single episode, severe ([place])   Total Time spent with patient: 45 minutes  Musculoskeletal: Strength & Muscle Tone: within normal limits Gait & Station: normal Patient leans: N/A  Psychiatric Specialty Exam: ROS  Blood pressure 132/83, pulse 67, temperature 98.7 F (37.1 C), temperature source Oral, resp. rate 18, height 5\' 6"  (1.676 m), weight 80.9 kg, last menstrual period 08/18/2019, SpO2 98 %.Body mass index is 28.79 kg/m.  General Appearance: Casual  Eye Contact::  Fair  Speech:  Clear and Coherent409  Volume:  Normal  Mood:  Euthymic  Affect:  congruent  Thought Process:  Coherent, Goal Directed and Descriptions of Associations: Intact  Orientation:  Full (Time, Place, and Person)  Thought Content:  Logical and Tangential  Suicidal Thoughts:  No  Homicidal Thoughts:  No  Memory:  Immediate;   Poor Remote;   Poor  Judgement:  Fair  Insight:  Fair  Psychomotor Activity:  Normal  Concentration:  Good  Recall:  Good  Fund of Knowledge:Good  Language: Good  Akathisia:  Negative  Handed:  Right  AIMS (if indicated):     Assets:  Communication Skills Desire for Improvement  Sleep:  Number of Hours: 7.25  Cognition: WNL  ADL's:  Intact   Mental Status Per Nursing Assessment::   On Admission:  NA  Demographic Factors:  Unemployed  Loss Factors: Decrease in vocational status  Historical Factors: NA  Risk Reduction Factors:   Positive social support and Positive therapeutic relationship  Continued Clinical Symptoms:  Previous Psychiatric Diagnoses and Treatments  Cognitive Features That Contribute To Risk:  None    Suicide Risk:  Minimal: No identifiable suicidal ideation.  Patients presenting with no risk  factors but with morbid ruminations; may be classified as minimal risk based on the severity of the depressive symptoms  Follow-up Information    [facility] Follow up on 09/14/2019.   Why: Therapy with Lorna Few is Thursday, 11/12 at 12:00p. Appt will be virtual.  Contact information: [address] [place] [address] [place] [facility] Follow up on 09/21/2019.   Specialty: Behavioral Health Why: Medication management with Dr. Adele Schilder is Thursday, 11/19 at 9:00a.  Appt will be virtual. You will recieve an email with the appt information.  Contact information: [place] [phone]          Plan Of Care/Follow-up recommendations:  Activity:  full  Karter Hellmer, MD 09/04/2019, 10:44 AM

## 2019-09-21 ENCOUNTER — Other Ambulatory Visit: Payer: Self-pay

## 2019-09-21 ENCOUNTER — Encounter (HOSPITAL_COMMUNITY): Payer: Self-pay | Admitting: Psychiatry

## 2019-09-21 ENCOUNTER — Ambulatory Visit (INDEPENDENT_AMBULATORY_CARE_PROVIDER_SITE_OTHER): Payer: BC Managed Care – PPO | Admitting: Psychiatry

## 2019-09-21 DIAGNOSIS — F250 Schizoaffective disorder, bipolar type: Secondary | ICD-10-CM

## 2019-09-21 DIAGNOSIS — F1994 Other psychoactive substance use, unspecified with psychoactive substance-induced mood disorder: Secondary | ICD-10-CM

## 2019-09-21 MED ORDER — TRAZODONE HCL 100 MG PO TABS: 100.0000 mg | ORAL_TABLET | Freq: Every evening | ORAL | 0 refills | Status: DC | PRN
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
## 2019-10-12 ENCOUNTER — Encounter (HOSPITAL_COMMUNITY): Payer: Self-pay | Admitting: Psychiatry

## 2019-10-12 ENCOUNTER — Other Ambulatory Visit: Payer: Self-pay

## 2019-10-12 ENCOUNTER — Ambulatory Visit (INDEPENDENT_AMBULATORY_CARE_PROVIDER_SITE_OTHER): Payer: BC Managed Care – PPO | Admitting: Psychiatry

## 2019-10-12 DIAGNOSIS — F250 Schizoaffective disorder, bipolar type: Secondary | ICD-10-CM

## 2019-10-12 DIAGNOSIS — Z79899 Other long term (current) drug therapy: Secondary | ICD-10-CM

## 2019-10-12 DIAGNOSIS — F1994 Other psychoactive substance use, unspecified with psychoactive substance-induced mood disorder: Secondary | ICD-10-CM | POA: Diagnosis not present

## 2019-10-12 MED ORDER — TRAZODONE HCL 100 MG PO TABS: 100.0000 mg | ORAL_TABLET | Freq: Every evening | ORAL | 1 refills | Status: DC | PRN

## 2019-10-12 MED ORDER — CARBAMAZEPINE 100 MG PO CHEW: CHEWABLE_TABLET | ORAL | 1 refills | Status: DC

## 2019-10-12 MED ORDER — RISPERIDONE 3 MG PO TABS: 6.0000 mg | ORAL_TABLET | Freq: Every day | ORAL | 1 refills | Status: DC

## 2019-10-12 NOTE — Progress Notes (Signed)
Virtual Visit via Telephone Note  I connected with Desiree Berger on 10/12/19 at  8:20 AM EST by telephone and verified that I am speaking with the correct person using two identifiers.   I discussed the limitations, risks, security and privacy concerns of performing an evaluation and management service by telephone and the availability of in person appointments. I also discussed with the patient that there may be a patient responsible charge related to this service. The patient expressed understanding and agreed to proceed.   History of Present Illness: Patient was evaluated by phone session.  She is a 23 year old employed Caucasian female with a history of schizoaffective disorder and polysubstance use.  She was inpatient in October and upon discharge seen by this writer in November.  She is taking Risperdal 6 mg, carbamazepine 100 mg in the morning and 200 mg at bedtime and trazodone.  Upon discharge she was complaining of excessive sedation, feeling tired and requesting to cut down her medication.  I recommended to cut down her trazodone from 150 mg to only 100 mg.  She reported some improvement but is still there are morning when she has excessive sedation and difficulty to wake up.  She is still struggle with cannabis and relapse earlier this week but now she decided to join an AA to help cannabis use.  Since she joined an AA to stay she has not been relapsed into cannabis.  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  She has a good Thanksgiving.  She is compliant with Risperdal, Tegretol, Cogentin and takes trazodone most of the night 100 mg.  She reported her hallucinations and irritability is much subsided.  She had a good support from her father, boyfriend and siblings.  She is also in touch with her mother on a regular basis and she usually has once a week dinner with mother.  Her parents live separate.  She admitted not able to schedule appointment with her therapist  Mickel Baas at [facility] because she is financially strained as hospital bills are coming.  But she reschedule for later date.  She reported no tremors, shakes or any EPS.  She denies any suicidal thoughts.  She denies any agitation or any anger.  Earlier her plan was to join [employer] but now she had decided to keep her job and start school when she is more stable.  Her appetite is okay.  Currently level is fair.  She like to focus on her health and this time she decided that she will take the medicine so she do not relapse.  Patient has 3 inpatient treatment.   Past Psychiatric History: H/O psychosis, severe mood swings, irritability, road rage, grandiosity and impulsive behavior.  H/O 3 inpatient due to hallucination and using drugs.  Last inpatient October 2020 at [facility] H/O sniffing Wellbutrin with cocaine in 2018. H/O heavy drinking, cocaine, MNDA, ecstasy, acid in college days. Saw psychiatrist [employer] and prescribed doxepin, Lexapro, Prozac.  Good response with Risperdal.     Psychiatric Specialty Exam: Physical Exam  Review of Systems  There were no vitals taken for this visit.There is no height or weight on file to calculate BMI.  General Appearance: NA  Eye Contact:  NA  Speech:  Clear and Coherent and Slow  Volume:  Decreased  Mood:  Euthymic  Affect:  NA  Thought Process:  Goal Directed  Orientation:  Full (Time, Place, and Person)  Thought Content:  Hallucinations: Nonspecific voices  Suicidal Thoughts:  No  Homicidal Thoughts:  No  Memory:  Immediate;   Good Recent;   Good Remote;   Good  Judgement:  Intact  Insight:  Present  Psychomotor Activity:  NA  Concentration:  Concentration: Fair and Attention Span: Fair  Recall:  [employer] of Knowledge:  Good  Language:  Good  Akathisia:  No  Handed:  Right  AIMS (if indicated):     Assets:  Communication Skills Desire for Improvement Housing Resilience Social Support Transportation  ADL's:  Intact  Cognition:   WNL  Sleep:   10 hrs     Assessment and Plan: Schizoaffective disorder, bipolar type.  Substance use.  I reviewed her medication.  She is still have sedation in the morning when she takes all her medication at bedtime.  I recommend to do like the carbamazepine to take 100 mg 3 times a day rather taking 1 in the morning and 2 at bedtime.  I also recommend that she should do blood work for Tegretol level and hemoglobin A1c which have was not done in the hospital.  I suggested that she can try taking trazodone 100 mg half to 1 tablet as needed to help her sedation.  She agreed with the plan.  Continue Risperdal 6 mg daily at present dose.  And Cogentin 0.5 mg twice a day.  Discussed safety concerns and anytime having active suicidal thoughts or homicidal thought then she need to call 911 or go to local emergency room.  Encouraged to continue and a since it has helped to stop cannabis use.  Follow-up in 4 weeks.  Time spent 25 minutes.  Follow Up Instructions:    I discussed the assessment and treatment plan with the patient. The patient was provided an opportunity to ask questions and all were answered. The patient agreed with the plan and demonstrated an understanding of the instructions.   The patient was advised to call back or seek an in-person evaluation if the symptoms worsen or if the condition fails to improve as anticipated.  I provided 25 minutes of non-face-to-face time during this encounter.   Cleotis Nipper, MD

## 2019-11-16 ENCOUNTER — Ambulatory Visit (HOSPITAL_COMMUNITY): Payer: BC Managed Care – PPO | Admitting: Psychiatry

## 2020-04-25 ENCOUNTER — Encounter (HOSPITAL_COMMUNITY): Payer: Self-pay | Admitting: Emergency Medicine

## 2020-04-25 ENCOUNTER — Ambulatory Visit (HOSPITAL_COMMUNITY)
Admission: EM | Admit: 2020-04-25 | Discharge: 2020-04-25 | Disposition: A | Discharge status: Home / Self Care | Payer: BC Managed Care – PPO

## 2020-04-25 DIAGNOSIS — J0100 Acute maxillary sinusitis, unspecified: Secondary | ICD-10-CM

## 2020-04-25 MED ORDER — AMOXICILLIN-POT CLAVULANATE 875-125 MG PO TABS: 1.0000 | ORAL_TABLET | Freq: Two times a day (BID) | ORAL | 0 refills | Status: DC

## 2020-04-25 NOTE — ED Triage Notes (Signed)
Pt c/o sinus pressure and pain onset about a month ago. Pt states that she has had voice changes and lots of drainage. Pt states she has been taking benadryl at night and allergy medicine in the morning and using a vaporizer to help with symptoms.

## 2020-04-25 NOTE — Discharge Instructions (Addendum)
Continue using the Flonase, cetirizine  Antibiotics for sinus infection Follow up as needed for continued or worsening symptoms

## 2020-04-26 NOTE — ED Provider Notes (Signed)
[facility]    CSN: [id number] Arrival date & time: 04/25/20  4098      History   Chief Complaint Chief Complaint  Patient presents with  . Sinus Problem    HPI Desiree Berger is a 24 y.o. female.   Patient is a 24 year old female who presents today with a proximately 1 month of sinus pressure, nasal congestion, postnasal drip and laryngitis.  Symptoms been constant, waxing waning.  She has been taking Benadryl at night Zyrtec during the day.  She is also been using a vaporizer.  This somewhat helps her symptoms.  Reporting also having low-grade fever.  No cough, chest congestion.  ROS per HPI      Past Medical History:  Diagnosis Date  . Asthma   . Seasonal allergies     Patient Active Problem List   Diagnosis Date Noted  . Schizoaffective disorder, bipolar type (HCC)   . Substance induced mood disorder (HCC) 09/08/2017  . 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 MDD (major depressive disorder), single episode, severe (HCC) 01/16/2014  . GAD (generalized anxiety disorder) 01/16/2014  . Closed fracture of left toe 04/14/2012    Past Surgical History:  Procedure Laterality Date  . ADENOIDECTOMY    . TONSILLECTOMY      OB History   No obstetric history on file.      Home Medications    Prior to Admission medications   Medication Sig Start Date End Date Taking? Authorizing Provider  Cetirizine HCl 10 MG CAPS Take 1 capsule (10 mg total) by mouth daily for 10 days. 11/16/18 04/25/20 Yes Wieters, Hallie C, PA-C  diphenhydrAMINE (BENADRYL) 25 MG tablet Take 25 mg by mouth at bedtime as needed.   Yes [provider]  albuterol (PROVENTIL HFA;VENTOLIN HFA) 108 (90 Base) MCG/ACT inhaler Inhale 1-2 puffs into the lungs every 6 (six) hours as needed for wheezing or shortness of breath. 08/08/18   Eustace Moore, MD  amoxicillin-clavulanate (AUGMENTIN) 875-125 MG tablet Take 1 tablet by  mouth every 12 (twelve) hours. 04/25/20   Dahlia Byes A, NP  benztropine (COGENTIN) 0.5 MG tablet Take 1 tablet (0.5 mg total) by mouth 2 (two) times daily. 09/04/19   Malvin Johns, MD  carbamazepine (TEGRETOL) 100 MG chewable tablet TID 10/12/19   Arfeen, Phillips Grout, MD  etonogestrel (NEXPLANON) 68 MG IMPL implant 1 each once by Subdermal route.    [provider]  hydroxypropyl methylcellulose / hypromellose (ISOPTO TEARS / GONIOVISC) 2.5 % ophthalmic solution Place 1 drop into both eyes 3 (three) times daily as needed for dry eyes.    [provider]  ibuprofen (ADVIL) 200 MG tablet Take 400 mg by mouth every 6 (six) hours as needed for moderate pain.    [provider]  risperiDONE (RISPERDAL) 3 MG tablet Take 2 tablets (6 mg total) by mouth at bedtime. 10/12/19   Arfeen, Phillips Grout, MD  traZODone (DESYREL) 100 MG tablet Take 1 tablet (100 mg total) by mouth at bedtime as needed for sleep. 10/12/19   Arfeen, Phillips Grout, MD  fluticasone (FLONASE) 50 MCG/ACT nasal spray Place 1-2 sprays into both nostrils daily for 7 days. Patient not taking: Reported on 09/01/2019 11/16/18 04/25/20  Lew Dawes, PA-C    Family History Family History  Problem Relation Age of Onset  . Hypertension Mother   . Mental illness Mother   . Hyperlipidemia Father   . Birth defects Maternal Grandmother   .  Heart attack Neg Hx   . Diabetes Neg Hx   . Sudden death Neg Hx     Social History Social History   Tobacco Use  . Smoking status: Current Every Day Smoker  . Smokeless tobacco: Never Used  . Tobacco comment: 2 cigarettes a day  Vaping Use  . Vaping Use: Every day  Substance Use Topics  . Alcohol use: No  . Drug use: Yes    Types: Cocaine, Marijuana    Comment: previously not currently using     Allergies   Patient has no known allergies.   Review of Systems Review of Systems   Physical Exam Triage Vital Signs ED Triage Vitals  Enc Vitals Group     BP 04/25/20 0912 (!)  112/51     Pulse Rate 04/25/20 0912 62     Resp 04/25/20 0912 14     Temp 04/25/20 0912 98.3 F (36.8 C)     Temp Source 04/25/20 0912 Oral     SpO2 04/25/20 0912 100 %     Weight --      Height --      Head Circumference --      Peak Flow --      Pain Score 04/25/20 0907 4     Pain Loc --      Pain Edu? --      Excl. in GC? --    No data found.  Updated Vital Signs BP (!) 112/51 (BP Location: Left Arm)   Pulse 62   Temp 98.3 F (36.8 C) (Oral)   Resp 14   SpO2 100%   Visual Acuity Right Eye Distance:   Left Eye Distance:   Bilateral Distance:    Right Eye Near:   Left Eye Near:    Bilateral Near:     Physical Exam Vitals and nursing note reviewed.  Constitutional:      General: She is not in acute distress.    Appearance: Normal appearance. She is not ill-appearing, toxic-appearing or diaphoretic.  HENT:     Head: Normocephalic.     Right Ear: Tympanic membrane and ear canal normal.     Left Ear: Tympanic membrane and ear canal normal.     Nose: Congestion present.     Comments: Maxillary sinus pressure.  Eyes:     Conjunctiva/sclera: Conjunctivae normal.  Cardiovascular:     Rate and Rhythm: Normal rate and regular rhythm.  Pulmonary:     Effort: Pulmonary effort is normal.     Breath sounds: Normal breath sounds.  Musculoskeletal:        General: Normal range of motion.     Cervical back: Normal range of motion.  Skin:    General: Skin is warm and dry.     Findings: No rash.  Neurological:     Mental Status: She is alert.  Psychiatric:        Mood and Affect: Mood normal.      UC Treatments / Results  Labs (all labs ordered are listed, but only abnormal results are displayed) Labs Reviewed - No data to display  EKG   Radiology No results found.  Procedures Procedures (including critical care time)  Medications Ordered in UC Medications - No data to display  Initial Impression / Assessment and Plan / UC Course  I have reviewed the  triage vital signs and the nursing notes.  Pertinent labs & imaging results that were available during my care of the patient were reviewed  by me and considered in my medical decision making (see chart for details).     Sinusitis  Treating with amoxicillin Continue the cetirizine and Flonase Follow up as needed for continued or worsening symptoms   Final Clinical Impressions(s) / UC Diagnoses   Final diagnoses:  Acute non-recurrent maxillary sinusitis     Discharge Instructions     Continue using the Flonase, cetirizine  Antibiotics for sinus infection Follow up as needed for continued or worsening symptoms     ED Prescriptions    Medication Sig Dispense Auth. Provider   amoxicillin-clavulanate (AUGMENTIN) 875-125 MG tablet Take 1 tablet by mouth every 12 (twelve) hours. 14 tablet Timberlee Roblero A, NP     PDMP not reviewed this encounter.   Loura Halt A, NP 04/26/20 0830

## 2020-05-28 ENCOUNTER — Ambulatory Visit (INDEPENDENT_AMBULATORY_CARE_PROVIDER_SITE_OTHER): Payer: BC Managed Care – PPO

## 2020-05-28 ENCOUNTER — Encounter (HOSPITAL_COMMUNITY): Payer: Self-pay

## 2020-05-28 ENCOUNTER — Ambulatory Visit (HOSPITAL_COMMUNITY)
Admission: EM | Admit: 2020-05-28 | Discharge: 2020-05-28 | Disposition: A | Discharge status: Home / Self Care | Payer: BC Managed Care – PPO | Attending: Family Medicine | Admitting: Family Medicine

## 2020-05-28 DIAGNOSIS — S6991XA Unspecified injury of right wrist, hand and finger(s), initial encounter: Secondary | ICD-10-CM

## 2020-05-28 DIAGNOSIS — M25531 Pain in right wrist: Secondary | ICD-10-CM | POA: Diagnosis not present

## 2020-05-28 DIAGNOSIS — M79641 Pain in right hand: Secondary | ICD-10-CM | POA: Diagnosis not present

## 2020-05-28 DIAGNOSIS — M25431 Effusion, right wrist: Secondary | ICD-10-CM

## 2020-05-28 DIAGNOSIS — M25532 Pain in left wrist: Secondary | ICD-10-CM

## 2020-05-28 NOTE — Discharge Instructions (Signed)
Please try ice  Please try ibuprofen as needed  Please follow up in about a week.
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
## 2020-05-28 NOTE — ED Provider Notes (Signed)
[facility]    CSN: [id number] Arrival date & time: 05/28/20  1845      History   Chief Complaint Chief Complaint  Patient presents with  . Wrist Injury  . Hand Injury    HPI Desiree Berger is a 24 y.o. female. She is presenting with right wrist pain. She had an injury after hitting her wrist on the table. Unsure of how her wrist hurt. Now having swelling over the ulnar aspect of the wrist.  She has ecchymosis between the second and third metacarpal joints.  HPI  Past Medical History:  Diagnosis Date  . Asthma   . Seasonal allergies     Patient Active Problem List   Diagnosis Date Noted  . Schizoaffective disorder, bipolar type (HCC)   . Substance induced mood disorder (HCC) 09/08/2017  . 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 MDD (major depressive disorder), single episode, severe (HCC) 01/16/2014  . GAD (generalized anxiety disorder) 01/16/2014  . Closed fracture of left toe 04/14/2012    Past Surgical History:  Procedure Laterality Date  . ADENOIDECTOMY    . TONSILLECTOMY      OB History   No obstetric history on file.      Home Medications    Prior to Admission medications   Medication Sig Start Date End Date Taking? Authorizing Provider  albuterol (PROVENTIL HFA;VENTOLIN HFA) 108 (90 Base) MCG/ACT inhaler Inhale 1-2 puffs into the lungs every 6 (six) hours as needed for wheezing or shortness of breath. 08/08/18   Eustace Moore, MD  amoxicillin-clavulanate (AUGMENTIN) 875-125 MG tablet Take 1 tablet by mouth every 12 (twelve) hours. 04/25/20   Dahlia Byes A, NP  benztropine (COGENTIN) 0.5 MG tablet Take 1 tablet (0.5 mg total) by mouth 2 (two) times daily. 09/04/19   Malvin Johns, MD  carbamazepine (TEGRETOL) 100 MG chewable tablet TID 10/12/19   Arfeen, Phillips Grout, MD  Cetirizine HCl 10 MG CAPS Take 1 capsule (10 mg total) by mouth daily for 10 days. 11/16/18 04/25/20  Wieters, Hallie C,  PA-C  diphenhydrAMINE (BENADRYL) 25 MG tablet Take 25 mg by mouth at bedtime as needed.    [provider]  etonogestrel (NEXPLANON) 68 MG IMPL implant 1 each once by Subdermal route.    [provider]  hydroxypropyl methylcellulose / hypromellose (ISOPTO TEARS / GONIOVISC) 2.5 % ophthalmic solution Place 1 drop into both eyes 3 (three) times daily as needed for dry eyes.    [provider]  ibuprofen (ADVIL) 200 MG tablet Take 400 mg by mouth every 6 (six) hours as needed for moderate pain.    [provider]  risperiDONE (RISPERDAL) 3 MG tablet Take 2 tablets (6 mg total) by mouth at bedtime. 10/12/19   Arfeen, Phillips Grout, MD  traZODone (DESYREL) 100 MG tablet Take 1 tablet (100 mg total) by mouth at bedtime as needed for sleep. 10/12/19   Arfeen, Phillips Grout, MD  fluticasone (FLONASE) 50 MCG/ACT nasal spray Place 1-2 sprays into both nostrils daily for 7 days. Patient not taking: Reported on 09/01/2019 11/16/18 04/25/20  Lew Dawes, PA-C    Family History Family History  Problem Relation Age of Onset  . Hypertension Mother   . Mental illness Mother   . Hyperlipidemia Father   . Birth defects Maternal Grandmother   . Heart attack Neg Hx   . Diabetes Neg Hx   . Sudden death Neg Hx     Social History  Social History   Tobacco Use  . Smoking status: Current Every Day Smoker  . Smokeless tobacco: Never Used  . Tobacco comment: 2 cigarettes a day  Vaping Use  . Vaping Use: Every day  Substance Use Topics  . Alcohol use: No  . Drug use: Yes    Types: Cocaine, Marijuana    Comment: previously not currently using     Allergies   Patient has no known allergies.   Review of Systems Review of Systems  See HPI  Physical Exam Triage Vital Signs ED Triage Vitals [05/28/20 2007]  Enc Vitals Group     BP 112/68     Pulse Rate 89     Resp 16     Temp 98.2 F (36.8 C)     Temp Source Oral     SpO2 98 %     Weight      Height      Head  Circumference      Peak Flow      Pain Score 0     Pain Loc      Pain Edu?      Excl. in GC?    No data found.  Updated Vital Signs BP 112/68 (BP Location: Right Arm)   Pulse 89   Temp 98.2 F (36.8 C) (Oral)   Resp 16   SpO2 98%   Visual Acuity Right Eye Distance:   Left Eye Distance:   Bilateral Distance:    Right Eye Near:   Left Eye Near:    Bilateral Near:     Physical Exam Gen: NAD, alert, cooperative with exam, well-appearing ENT: normal lips, normal nasal mucosa,  Neuro: normal tone, normal sensation to touch Psych:  normal insight, alert and oriented MSK:  Right hand/wrist: Ecchymosis on the dorsal aspect of the second third MCP joints. No malrotation or misalignment. Limited flexion and extension of the wrist. Swelling over the ulnar aspect of the wrist. No ecchymosis of the wrist. Tenderness palpation of the TFCC. Neurovascularly intact   UC Treatments / Results  Labs (all labs ordered are listed, but only abnormal results are displayed) Labs Reviewed - No data to display  EKG   Radiology DG Wrist Complete Right  Result Date: 05/28/2020 CLINICAL DATA:  Injury and swelling EXAM: RIGHT WRIST - COMPLETE 3+ VIEW COMPARISON:  02/26/2007 FINDINGS: There is no evidence of fracture or dislocation. There is no evidence of arthropathy or other focal bone abnormality. Soft tissues are unremarkable. IMPRESSION: Negative. Electronically Signed   By: Jasmine Pang M.D.   On: 05/28/2020 20:30   DG Hand Complete Right  Result Date: 05/28/2020 CLINICAL DATA:  Right hand and wrist pain after injury today. EXAM: RIGHT HAND - COMPLETE 3+ VIEW COMPARISON:  Wrist radiograph earlier this day. FINDINGS: There is no evidence of fracture or dislocation. There is no evidence of arthropathy or other focal bone abnormality. Soft tissues are unremarkable. IMPRESSION: Negative radiographs of the right hand. Electronically Signed   By: Narda Rutherford M.D.   On: 05/28/2020 20:37      Procedures Procedures (including critical care time)  1. arm  2. Right 3. Long arm 4. Ortho glass 5.  applied by Dr. Jordan Likes   Medications Ordered in UC Medications - No data to display  Initial Impression / Assessment and Plan / UC Course  I have reviewed the triage vital signs and the nursing notes.  Pertinent labs & imaging results that were available during my care of the  patient were reviewed by me and considered in my medical decision making (see chart for details).     Desiree Berger is a 24 year old female that is presenting with right hand and wrist pain.  Imaging was negative for fracture.  Independent review of the x-ray suggest widening of the carpal joints to suggest ligamentous injury.  Placed in long-arm splint.  Counseled supportive care.  Give indications follow-up.  Final Clinical Impressions(s) / UC Diagnoses   Final diagnoses:  Left wrist pain     Discharge Instructions     Please try ice  Please try ibuprofen as needed  Please follow up in about a week.     ED Prescriptions    None     PDMP not reviewed this encounter.   Myra Rude, MD 05/28/20 2111

## 2020-05-31 ENCOUNTER — Other Ambulatory Visit: Payer: Self-pay

## 2020-05-31 ENCOUNTER — Ambulatory Visit (INDEPENDENT_AMBULATORY_CARE_PROVIDER_SITE_OTHER): Payer: BC Managed Care – PPO | Admitting: Family Medicine

## 2020-05-31 ENCOUNTER — Ambulatory Visit: Payer: Self-pay

## 2020-05-31 ENCOUNTER — Encounter: Payer: Self-pay | Admitting: Family Medicine

## 2020-05-31 VITALS — BP 115/56 | HR 71 | Ht 66.0 in | Wt 194.0 lb

## 2020-05-31 DIAGNOSIS — S63501A Unspecified sprain of right wrist, initial encounter: Secondary | ICD-10-CM

## 2020-05-31 DIAGNOSIS — M25531 Pain in right wrist: Secondary | ICD-10-CM

## 2020-05-31 NOTE — Patient Instructions (Signed)
Good to see you Please try ice  Please try the brace  Please try the exercises   Please send me a message in MyChart with any questions or updates.  Please see me back in 4 weeks.   --Dr. Jordan Likes
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
## 2020-05-31 NOTE — Progress Notes (Signed)
Desiree Berger - 24 y.o. female MRN [id number]  Date of birth: [birth year]  SUBJECTIVE:  Including CC & ROS.  Chief Complaint  Patient presents with  . Wrist Injury    right x 05/28/2020    Desiree Berger is a 24 y.o. female that is presenting with right wrist pain.  She hit her ulnar aspect of her wrist on the table.  Since that time she has had pain.  She was evaluated in the urgent care and placed in a splint.  Her swelling and pain have improved.  Is been taking Tylenol for the pain.  No history of similar pain..  Independent review of the right hand and wrist x-rays from 7/27 shows no fracture.   Review of Systems See HPI   HISTORY: Past Medical, Surgical, Social, and Family History Reviewed & Updated per EMR.   Pertinent Historical Findings include:  Past Medical History:  Diagnosis Date  . Asthma   . Seasonal allergies     Past Surgical History:  Procedure Laterality Date  . ADENOIDECTOMY    . TONSILLECTOMY      Family History  Problem Relation Age of Onset  . Hypertension Mother   . Mental illness Mother   . Hyperlipidemia Father   . Birth defects Maternal Grandmother   . Heart attack Neg Hx   . Diabetes Neg Hx   . Sudden death Neg Hx     Social History   Socioeconomic History  . Marital status: Single    Spouse name: Not on file  . Number of children: Not on file  . Years of education: Not on file  . Highest education level: Not on file  Occupational History  . Not on file  Tobacco Use  . Smoking status: Current Every Day Smoker  . Smokeless tobacco: Never Used  . Tobacco comment: 2 cigarettes a day  Vaping Use  . Vaping Use: Every day  Substance and Sexual Activity  . Alcohol use: No  . Drug use: Yes    Types: Cocaine, Marijuana    Comment: previously not currently using  . Sexual activity: Not Currently    Birth control/protection: Abstinence  Other Topics Concern  . Not on file  Social History Narrative  . Not on  file   Social Determinants of Health   Financial Resource Strain:   . Difficulty of Paying Living Expenses:   Food Insecurity:   . Worried About Programme researcher, broadcasting/film/video in the Last Year:   . Barista in the Last Year:   Transportation Needs:   . Freight forwarder (Medical):   Marland Kitchen Lack of Transportation (Non-Medical):   Physical Activity:   . Days of Exercise per Week:   . Minutes of Exercise per Session:   Stress:   . Feeling of Stress :   Social Connections:   . Frequency of Communication with Friends and Family:   . Frequency of Social Gatherings with Friends and Family:   . Attends Religious Services:   . Active Member of Clubs or Organizations:   . Attends Banker Meetings:   Marland Kitchen Marital Status:   Intimate Partner Violence:   . Fear of Current or Ex-Partner:   . Emotionally Abused:   Marland Kitchen Physically Abused:   . Sexually Abused:      PHYSICAL EXAM:  VS: BP (!) 115/56   Pulse 71   Ht 5\' 6"  (1.676 m)   Wt 194 lb (88 kg)   BMI  31.31 kg/m  Physical Exam Gen: NAD, alert, cooperative with exam, well-appearing MSK:  Right wrist: Tenderness palpation over the ulnar aspect. No ecchymosis or swelling. Normal wrist range of motion. Normal grip strength. No malrotation or misalignment. Neurovascularly intact  Limited ultrasound: Right wrist:  No changes of the distal ulna. Normal-appearing ECU and insertion into the base of the fifth. No hyperemia in this area. No bony changes of the carpal bones. No obvious effusion.  Summary: No structural changes appreciated  Ultrasound and interpretation by Clare Gandy, MD    ASSESSMENT & PLAN:   Wrist sprain, right, initial encounter Injury occurred on 7/26.  She was immobilized at that time.  Imaging has been unrevealing for any structural changes.  Possible for TFCC or an occult fracture. -Counseled on home exercise therapy and supportive care. -Brace. -Could consider MRI if pain is still  ongoing.

## 2020-07-02 ENCOUNTER — Ambulatory Visit: Payer: BC Managed Care – PPO | Admitting: Registered Nurse

## 2020-07-16 ENCOUNTER — Encounter (HOSPITAL_COMMUNITY): Payer: Self-pay

## 2020-07-16 ENCOUNTER — Ambulatory Visit (HOSPITAL_COMMUNITY)
Admission: EM | Admit: 2020-07-16 | Discharge: 2020-07-16 | Disposition: A | Discharge status: Home / Self Care | Payer: BC Managed Care – PPO | Attending: Family Medicine | Admitting: Family Medicine

## 2020-07-16 ENCOUNTER — Other Ambulatory Visit: Payer: Self-pay

## 2020-07-16 DIAGNOSIS — R0602 Shortness of breath: Secondary | ICD-10-CM

## 2020-07-16 DIAGNOSIS — J4541 Moderate persistent asthma with (acute) exacerbation: Secondary | ICD-10-CM

## 2020-07-16 MED ORDER — ALBUTEROL SULFATE HFA 108 (90 BASE) MCG/ACT IN AERS: 1.0000 | INHALATION_SPRAY | Freq: Four times a day (QID) | RESPIRATORY_TRACT | 2 refills | Status: DC | PRN

## 2020-07-16 MED ORDER — ALBUTEROL SULFATE HFA 108 (90 BASE) MCG/ACT IN AERS
2.0000 | INHALATION_SPRAY | Freq: Once | RESPIRATORY_TRACT | Status: AC
  Administered 2020-07-16: 2 via RESPIRATORY_TRACT

## 2020-07-16 MED ORDER — ALBUTEROL SULFATE HFA 108 (90 BASE) MCG/ACT IN AERS
INHALATION_SPRAY | RESPIRATORY_TRACT | Status: AC
  Filled 2020-07-16: qty 6.7

## 2020-07-16 NOTE — ED Provider Notes (Signed)
[facility]   [id number] 07/16/20 Arrival Time: 6389  ASSESSMENT & PLAN:  1. Shortness of breath   2. Moderate persistent asthma with exacerbation     Feeling much better with alb use. Declines Rx prednisone.  Meds ordered this encounter  Medications  . albuterol (VENTOLIN HFA) 108 (90 Base) MCG/ACT inhaler 2 puff  . albuterol (VENTOLIN HFA) 108 (90 Base) MCG/ACT inhaler    Sig: Inhale 1-2 puffs into the lungs every 6 (six) hours as needed for wheezing or shortness of breath.    Dispense:  18 g    Refill:  2    Asthma precautions given. OTC symptom care as needed.  Recommend:  Follow-up Information    [facility].   Specialty: Urgent Care Why: As needed. Contact information: [address] [place] [address] [phone]              Reviewed expectations re: course of current medical issues. Questions answered. Outlined signs and symptoms indicating need for more acute intervention. Patient verbalized understanding. After Visit Summary given.  SUBJECTIVE: History from: patient.  Desiree Berger is a 24 y.o. female who presents with complaint of fairly persistent wheezing. Onset abrupt, today. Triggers: None. Describes wheezing as moderate when present. Fever: no. Overall normal PO intake without n/v. Sick contacts: no. Ambulatory without difficulty. No LE edema. Typically her asthma is well controlled. Inhaler use: none; needs new inhaler; out secondary to financial reasons. OTC treatment: none.   Social History   Tobacco Use  Smoking Status Current Every Day Smoker  Smokeless Tobacco Never Used  Tobacco Comment   2 cigarettes a day      OBJECTIVE:  Vitals:   07/16/20 1047  BP: 103/71  Pulse: 66  Resp: 18  Temp: 98.8 F (37.1 C)  TempSrc: Oral  SpO2: 98%     General appearance: alert; NAD HEENT: [place]; AT; without nasal congestion Neck: supple without LAD Cv: RRR without  murmer Lungs: unlabored respirations, moderate bilateral expiratory wheezing; cough: absent; no significant respiratory distress Skin: warm and dry Psychological: alert and cooperative; normal mood and affect   No Known Allergies  Past Medical History:  Diagnosis Date  . Asthma   . Seasonal allergies    Family History  Problem Relation Age of Onset  . Hypertension Mother   . Mental illness Mother   . Hyperlipidemia Father   . Birth defects Maternal Grandmother   . Heart attack Neg Hx   . Diabetes Neg Hx   . Sudden death Neg Hx    Social History   Socioeconomic History  . Marital status: Single    Spouse name: Not on file  . Number of children: Not on file  . Years of education: Not on file  . Highest education level: Not on file  Occupational History  . Not on file  Tobacco Use  . Smoking status: Current Every Day Smoker  . Smokeless tobacco: Never Used  . Tobacco comment: 2 cigarettes a day  Vaping Use  . Vaping Use: Every day  Substance and Sexual Activity  . Alcohol use: No  . Drug use: Yes    Types: Cocaine, Marijuana    Comment: previously not currently using  . Sexual activity: Not Currently    Birth control/protection: Abstinence  Other Topics Concern  . Not on file  Social History Narrative  . Not on file   Social Determinants of Health   Financial Resource Strain:   .  Difficulty of Paying Living Expenses: Not on file  Food Insecurity:   . Worried About Programme researcher, broadcasting/film/video in the Last Year: Not on file  . Ran Out of Food in the Last Year: Not on file  Transportation Needs:   . Lack of Transportation (Medical): Not on file  . Lack of Transportation (Non-Medical): Not on file  Physical Activity:   . Days of Exercise per Week: Not on file  . Minutes of Exercise per Session: Not on file  Stress:   . Feeling of Stress : Not on file  Social Connections:   . Frequency of Communication with Friends and Family: Not on file  . Frequency of Social  Gatherings with Friends and Family: Not on file  . Attends Religious Services: Not on file  . Active Member of Clubs or Organizations: Not on file  . Attends Banker Meetings: Not on file  . Marital Status: Not on file  Intimate Partner Violence:   . Fear of Current or Ex-Partner: Not on file  . Emotionally Abused: Not on file  . Physically Abused: Not on file  . Sexually Abused: Not on file            Mardella Layman, MD 07/16/20 1254

## 2020-07-16 NOTE — ED Triage Notes (Signed)
Pt reports having shortness when taking a deep breath since this morning. States she has asthma and can not afford an inhaler. Denies fever, cough, body aches, or any other symptoms.

## 2020-07-16 NOTE — ED Notes (Signed)
Patient did not answer when called x 1

## 2020-08-20 ENCOUNTER — Telehealth: Payer: BC Managed Care – PPO | Admitting: Physician Assistant

## 2020-08-20 DIAGNOSIS — J069 Acute upper respiratory infection, unspecified: Secondary | ICD-10-CM

## 2020-08-20 MED ORDER — BENZONATATE 100 MG PO CAPS: 100.0000 mg | ORAL_CAPSULE | Freq: Three times a day (TID) | ORAL | 0 refills | Status: AC

## 2020-08-20 MED ORDER — FLUTICASONE PROPIONATE 50 MCG/ACT NA SUSP: 2.0000 | Freq: Every day | NASAL | 0 refills | Status: DC

## 2020-08-20 MED ORDER — ALBUTEROL SULFATE HFA 108 (90 BASE) MCG/ACT IN AERS: 2.0000 | INHALATION_SPRAY | Freq: Four times a day (QID) | RESPIRATORY_TRACT | 0 refills | Status: DC | PRN

## 2020-08-20 MED ORDER — PREDNISONE 50 MG PO TABS: 50.0000 mg | ORAL_TABLET | Freq: Every day | ORAL | 0 refills | Status: AC

## 2020-08-20 NOTE — Progress Notes (Signed)
We are sorry that you are not feeling well.  Here is how we plan to help!  Based on your presentation I believe you most likely have A cough due to a virus.  This is called viral bronchitis and is best treated by rest, plenty of fluids and control of the cough.  You may use Ibuprofen or Tylenol as directed to help your symptoms.     In addition you may use A prescription cough medication called Tessalon Perles 100mg . You may take 1-2 capsules every 8 hours as needed for your cough.  I have also prescribed an albuterol inhaler, prednisone and a steroid nasal spray called fluticasone.   From your responses in the eVisit questionnaire you describe inflammation in the upper respiratory tract which is causing a significant cough.  This is commonly called Bronchitis and has four common causes:    Allergies  Viral Infections  Acid Reflux  Bacterial Infection Allergies, viruses and acid reflux are treated by controlling symptoms or eliminating the cause. An example might be a cough caused by taking certain blood pressure medications. You stop the cough by changing the medication. Another example might be a cough caused by acid reflux. Controlling the reflux helps control the cough.  USE OF BRONCHODILATOR ("RESCUE") INHALERS: There is a risk from using your bronchodilator too frequently.  The risk is that over-reliance on a medication which only relaxes the muscles surrounding the breathing tubes can reduce the effectiveness of medications prescribed to reduce swelling and congestion of the tubes themselves.  Although you feel brief relief from the bronchodilator inhaler, your asthma may actually be worsening with the tubes becoming more swollen and filled with mucus.  This can delay other crucial treatments, such as oral steroid medications. If you need to use a bronchodilator inhaler daily, several times per day, you should discuss this with your provider.  There are probably better treatments that  could be used to keep your asthma under control.     HOME CARE . Only take medications as instructed by your medical team. . Complete the entire course of an antibiotic. . Drink plenty of fluids and get plenty of rest. . Avoid close contacts especially the very young and the elderly . Cover your mouth if you cough or cough into your sleeve. . Always remember to wash your hands . A steam or ultrasonic humidifier can help congestion.   GET HELP RIGHT AWAY IF: . You develop worsening fever. . You become short of breath . You cough up blood. . Your symptoms persist after you have completed your treatment plan MAKE SURE YOU   Understand these instructions.  Will watch your condition.  Will get help right away if you are not doing well or get worse.  Your e-visit answers were reviewed by a board certified advanced clinical practitioner to complete your personal care plan.  Depending on the condition, your plan could have included both over the counter or prescription medications. If there is a problem please reply  once you have received a response from your provider. Your safety is important to .  If you have drug allergies check your prescription carefully.    You can use MyChart to ask questions about today's visit, request a non-urgent call back, or ask for a work or school excuse for 24 hours related to this e-Visit. If it has been greater than 24 hours you will need to follow up with your provider, or enter a new e-Visit to address those concerns.  You will get an e-mail in the next two days asking about your experience.  I hope that your e-visit has been valuable and will speed your recovery. Thank you for using e-visits.  Approximately 5 minutes was spent documenting and reviewing patient's chart.

## 2021-07-07 ENCOUNTER — Encounter (HOSPITAL_COMMUNITY): Payer: Self-pay | Admitting: Emergency Medicine

## 2021-07-07 ENCOUNTER — Ambulatory Visit (HOSPITAL_COMMUNITY)
Admission: EM | Admit: 2021-07-07 | Discharge: 2021-07-07 | Disposition: A | Discharge status: Home / Self Care | Payer: Self-pay | Attending: Student | Admitting: Student

## 2021-07-07 ENCOUNTER — Other Ambulatory Visit: Payer: Self-pay

## 2021-07-07 DIAGNOSIS — J069 Acute upper respiratory infection, unspecified: Secondary | ICD-10-CM

## 2021-07-07 DIAGNOSIS — Z9089 Acquired absence of other organs: Secondary | ICD-10-CM

## 2021-07-07 DIAGNOSIS — Z8616 Personal history of COVID-19: Secondary | ICD-10-CM

## 2021-07-07 LAB — SARS CORONAVIRUS 2 (TAT 6-24 HRS): SARS Coronavirus 2: NEGATIVE

## 2021-07-07 MED ORDER — PROMETHAZINE-DM 6.25-15 MG/5ML PO SYRP: 5.0000 mL | ORAL_SOLUTION | Freq: Four times a day (QID) | ORAL | 0 refills | Status: DC | PRN

## 2021-07-07 MED ORDER — FLUTICASONE PROPIONATE 50 MCG/ACT NA SUSP: 2.0000 | Freq: Every day | NASAL | 2 refills | Status: DC

## 2021-07-07 NOTE — ED Triage Notes (Addendum)
Onset of symptoms was 9/1.  Patient has irritated throat, cough, slight headache, sinus pressure, night sweats.  Diagnosed with covid last month-august

## 2021-07-07 NOTE — ED Provider Notes (Signed)
[facility]    CSN: [id number] Arrival date & time: 07/07/21  0801      History   Chief Complaint Chief Complaint  Patient presents with   Cough    HPI Desiree Berger is a 25 y.o. female presenting with cough. Medical history asthma, seasonal allergies, covid-19 one month ago.  Patient states that she did have COVID-19 1 month ago, felt better for about 2 weeks before her current symptoms.  Endorses about 4 days of nasal congestion, pressure behind nose, cough, severe sore throat.  Has tried Benadryl for her symptoms without improvement.  Last menstrual period was 2 days ago, states she is not pregnant. Has not monitored temperature at home. Denies fevers/chills, n/v/d, shortness of breath, chest pain, teeth pain, headaches, loss of taste/smell, swollen lymph nodes, ear pain.    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      Home Medications    Prior to Admission medications   Medication Sig Start Date End Date Taking? Authorizing Provider  fluticasone (FLONASE) 50 MCG/ACT nasal spray Place 2 sprays into both nostrils daily. 07/07/21  Yes Rhys Martini, PA-C  promethazine-dextromethorphan (PROMETHAZINE-DM) 6.25-15 MG/5ML syrup Take 5 mLs by mouth 4 (four) times daily as needed for cough. 07/07/21  Yes Rhys Martini, PA-C   albuterol (VENTOLIN HFA) 108 (90 Base) MCG/ACT inhaler Inhale 2 puffs into the lungs every 6 (six) hours as needed for wheezing or shortness of breath. 08/20/20   Couture, Cortni S, PA-C  etonogestrel (NEXPLANON) 68 MG IMPL implant 1 each once by Subdermal route.    [provider]  ibuprofen (ADVIL) 200 MG tablet Take 400 mg by mouth every 6 (six) hours as needed for moderate pain.    [provider]    Family History Family History  Problem Relation Age of Onset   Hypertension Mother    Mental illness Mother    Hyperlipidemia Father    Birth defects Maternal Grandmother    Heart attack Neg Hx    Diabetes Neg Hx    Sudden death Neg Hx     Social History Social History   Tobacco Use   Smoking status: Some Days    Types: Cigarettes   Smokeless tobacco: Never   Tobacco comments:    2 cigarettes a day  Vaping Use   Vaping Use: Every day  Substance Use Topics   Alcohol use: No   Drug use: Yes    Types: Cocaine, Marijuana    Comment: previously not currently using     Allergies   Patient has no known allergies.   Review of Systems Review of Systems  Constitutional:  Negative for appetite change, chills and fever.  HENT:  Positive for congestion and sore throat. Negative for ear pain, rhinorrhea, sinus pressure and sinus pain.  Eyes:  Negative for redness and visual disturbance.  Respiratory:  Positive for cough. Negative for chest tightness, shortness of breath and wheezing.   Cardiovascular:  Negative for chest pain and palpitations.  Gastrointestinal:  Negative for abdominal pain, constipation, diarrhea, nausea and vomiting.  Genitourinary:  Negative for dysuria, frequency and urgency.  Musculoskeletal:  Negative for myalgias.  Neurological:  Negative for dizziness, weakness and headaches.  Psychiatric/Behavioral:  Negative for confusion.   All other systems reviewed and are negative.   Physical Exam Triage Vital Signs ED Triage Vitals  Enc  Vitals Group     BP 07/07/21 0838 112/78     Pulse Rate 07/07/21 0838 73     Resp 07/07/21 0838 18     Temp 07/07/21 0838 98.8 F (37.1 C)     Temp Source 07/07/21 0838 Oral     SpO2 07/07/21 0838 96 %     Weight --      Height --      Head Circumference --      Peak Flow --      Pain Score 07/07/21 0830 7     Pain Loc --      Pain Edu? --      Excl. in GC? --    No data found.  Updated Vital Signs BP 112/78 (BP Location: Left Arm)   Pulse 73   Temp 98.8 F (37.1 C) (Oral)   Resp 18   SpO2 96%   Visual Acuity Right Eye Distance:   Left Eye Distance:   Bilateral Distance:    Right Eye Near:   Left Eye Near:    Bilateral Near:     Physical Exam Vitals reviewed.  Constitutional:      General: She is not in acute distress.    Appearance: Normal appearance. She is not ill-appearing.  HENT:     Head: Normocephalic and atraumatic.     Right Ear: Tympanic membrane, ear canal and external ear normal. No tenderness. No middle ear effusion. There is no impacted cerumen. Tympanic membrane is not perforated, erythematous, retracted or bulging.     Left Ear: Tympanic membrane, ear canal and external ear normal. No tenderness.  No middle ear effusion. There is no impacted cerumen. Tympanic membrane is not perforated, erythematous, retracted or bulging.     Nose: Nose normal. No congestion.     Mouth/Throat:     Mouth: Mucous membranes are moist.     Pharynx: Uvula midline. No oropharyngeal exudate or posterior oropharyngeal erythema.  Eyes:     Extraocular Movements: Extraocular movements intact.     Pupils: Pupils are equal, round, and reactive to light.  Cardiovascular:     Rate and Rhythm: Normal rate and regular rhythm.     Heart sounds: Normal heart sounds.  Pulmonary:     Effort: Pulmonary effort is normal.     Breath sounds: Normal breath sounds. No decreased breath sounds, wheezing, rhonchi or rales.  Abdominal:     Palpations: Abdomen is soft.     Tenderness:  There is no abdominal tenderness. There is no guarding or rebound.  Neurological:     General: No focal deficit present.     Mental Status: She is alert and oriented to person, place, and time.  Psychiatric:        Mood and Affect: Mood normal.        Behavior: Behavior normal.        Thought Content: Thought content normal.  Judgment: Judgment normal.     UC Treatments / Results  Labs (all labs ordered are listed, but only abnormal results are displayed) Labs Reviewed  SARS CORONAVIRUS 2 (TAT 6-24 HRS)    EKG   Radiology No results found.  Procedures Procedures (including critical care time)  Medications Ordered in UC Medications - No data to display  Initial Impression / Assessment and Plan / UC Course  I have reviewed the triage vital signs and the nursing notes.  Pertinent labs & imaging results that were available during my care of the patient were reviewed by me and considered in my medical decision making (see chart for details).     This patient is a very pleasant 25 y.o. year old female presenting with viral URI with cough. Today this pt is afebrile nontachycardic nontachypneic, oxygenating well on room air, no wheezes rhonchi or rales. Implant for contraception and LMP 2 days ago. History tonsillectomy. Covid-19 one month ago, felt better following this before new symptoms.    Symptomatic relief with promethazine DM, flonase.   ED return precautions discussed. Patient verbalizes understanding and agreement.    Final Clinical Impressions(s) / UC Diagnoses   Final diagnoses:  Viral URI with cough  History of tonsillectomy  History of COVID-19     Discharge Instructions      -Promethazine DM cough syrup for congestion/cough. This could make you drowsy, so take at night before bed. -Flonase nasal steroid: place 2 sprays into both nostrils in the morning and at bedtime for at least 7 days. Continue for longer if this is helping. -With a virus,  you're typically contagious for 5-7 days, or as long as you're having fevers.       ED Prescriptions     Medication Sig Dispense Auth. Provider   promethazine-dextromethorphan (PROMETHAZINE-DM) 6.25-15 MG/5ML syrup Take 5 mLs by mouth 4 (four) times daily as needed for cough. 118 mL Ignacia Bayley E, PA-C   fluticasone [facility]) 50 MCG/ACT nasal spray Place 2 sprays into both nostrils daily. 15 mL Rhys Martini, PA-C      PDMP not reviewed this encounter.   Rhys Martini, PA-C 07/07/21 1000

## 2021-07-07 NOTE — Discharge Instructions (Addendum)
-  Promethazine DM cough syrup for congestion/cough. This could make you drowsy, so take at night before bed. -Flonase nasal steroid: place 2 sprays into both nostrils in the morning and at bedtime for at least 7 days. Continue for longer if this is helping. -With a virus, you're typically contagious for 5-7 days, or as long as you're having fevers.

## 2021-07-09 ENCOUNTER — Telehealth: Payer: Self-pay | Admitting: Physician Assistant

## 2021-07-09 DIAGNOSIS — B9689 Other specified bacterial agents as the cause of diseases classified elsewhere: Secondary | ICD-10-CM

## 2021-07-09 DIAGNOSIS — J208 Acute bronchitis due to other specified organisms: Secondary | ICD-10-CM

## 2021-07-09 MED ORDER — AZITHROMYCIN 250 MG PO TABS: ORAL_TABLET | ORAL | 0 refills | Status: AC

## 2021-07-09 NOTE — Patient Instructions (Signed)
Desiree Berger, thank you for joining Piedad Climes, PA-C for today's virtual visit.  While this provider is not your primary care provider (PCP), if your PCP is located in our provider database this encounter information will be shared with them immediately following your visit.  Consent: (Patient) Desiree Berger provided verbal consent for this virtual visit at the beginning of the encounter.  Current Medications:  Current Outpatient Medications:    albuterol (VENTOLIN HFA) 108 (90 Base) MCG/ACT inhaler, Inhale 2 puffs into the lungs every 6 (six) hours as needed for wheezing or shortness of breath., Disp: 8 g, Rfl: 0   etonogestrel (NEXPLANON) 68 MG IMPL implant, 1 each once by Subdermal route., Disp: , Rfl:    fluticasone (FLONASE) 50 MCG/ACT nasal spray, Place 2 sprays into both nostrils daily., Disp: 15 mL, Rfl: 2   ibuprofen (ADVIL) 200 MG tablet, Take 400 mg by mouth every 6 (six) hours as needed for moderate pain., Disp: , Rfl:    promethazine-dextromethorphan (PROMETHAZINE-DM) 6.25-15 MG/5ML syrup, Take 5 mLs by mouth 4 (four) times daily as needed for cough., Disp: 118 mL, Rfl: 0   Medications ordered in this encounter:  No orders of the defined types were placed in this encounter.    *If you need refills on other medications prior to your next appointment, please contact your pharmacy*  Follow-Up: Call back or seek an in-person evaluation if the symptoms worsen or if the condition fails to improve as anticipated.  Other Instructions Take antibiotic (Azithromycin) as directed.  Increase fluids.  Get plenty of rest. Use Mucinex for congestion. Continue the prescription cough medication. Take a daily probiotic (I recommend Align or Culturelle, but even Activia Yogurt may be beneficial).  A humidifier placed in the bedroom may offer some relief for a dry, scratchy throat of nasal irritation.  Read information below on acute bronchitis.   You need  in-person evaluation if symptoms are not resolving, anything worsens or new symptoms develop.  Acute Bronchitis Bronchitis is when the airways that extend from the windpipe into the lungs get red, puffy, and painful (inflamed). Bronchitis often causes thick spit (mucus) to develop. This leads to a cough. A cough is the most common symptom of bronchitis. In acute bronchitis, the condition usually begins suddenly and goes away over time (usually in 2 weeks). Smoking, allergies, and asthma can make bronchitis worse. Repeated episodes of bronchitis may cause more lung problems.  HOME CARE Rest. Drink enough fluids to keep your pee (urine) clear or pale yellow (unless you need to limit fluids as told by your doctor). Only take over-the-counter or prescription medicines as told by your doctor. Avoid smoking and secondhand smoke. These can make bronchitis worse. If you are a smoker, think about using nicotine gum or skin patches. Quitting smoking will help your lungs heal faster. Reduce the chance of getting bronchitis again by: Washing your hands often. Avoiding people with cold symptoms. Trying not to touch your hands to your mouth, nose, or eyes. Follow up with your doctor as told.  GET HELP IF: Your symptoms do not improve after 1 week of treatment. Symptoms include: Cough. Fever. Coughing up thick spit. Body aches. Chest congestion. Chills. Shortness of breath. Sore throat.  GET HELP RIGHT AWAY IF:  You have an increased fever. You have chills. You have severe shortness of breath. You have bloody thick spit (sputum). You throw up (vomit) often. You lose too much body fluid (dehydration). You have a severe headache. You  faint.  MAKE SURE YOU:  Understand these instructions. Will watch your condition. Will get help right away if you are not doing well or get worse. Document Released: 04/06/2008 Document Revised: 06/21/2013 Document Reviewed: 04/11/2013 [facility] Patient  Information 2015 [place], [place]. This information is not intended to replace advice given to you by your health care provider. Make sure you discuss any questions you have with your health care provider.    If you have been instructed to have an in-person evaluation today at a local Urgent Care facility, please use the link below. It will take you to a list of all of our available [facility], including address, phone number and hours of operation. Please do not delay care.  [facility]  If you or a family member do not have a primary care provider, use the link below to schedule a visit and establish care. When you choose a [facility] primary care physician or advanced practice provider, you gain a long-term partner in health. Find a Primary Care Provider  Learn more about [facility] in-office and virtual care options: [facility] - Get Care Now

## 2021-07-09 NOTE — Progress Notes (Signed)
Virtual Visit Consent   Desiree Berger, you are scheduled for a virtual visit with a [facility] provider today.     Just as with appointments in the office, your consent must be obtained to participate.  Your consent will be active for this visit and any virtual visit you may have with one of our providers in the next 365 days.     If you have a MyChart account, a copy of this consent can be sent to you electronically.  All virtual visits are billed to your insurance company just like a traditional visit in the office.    As this is a virtual visit, video technology does not allow for your provider to perform a traditional examination.  This may limit your provider's ability to fully assess your condition.  If your provider identifies any concerns that need to be evaluated in person or the need to arrange testing (such as labs, EKG, etc.), we will make arrangements to do so.     Although advances in technology are sophisticated, we cannot ensure that it will always work on either your end or our end.  If the connection with a video visit is poor, the visit may have to be switched to a telephone visit.  With either a video or telephone visit, we are not always able to ensure that we have a secure connection.     I need to obtain your verbal consent now.   Are you willing to proceed with your visit today?    Desiree Berger has provided verbal consent on 07/09/2021 for a virtual visit (video or telephone).   Desiree Berger, [place]   Date: 07/09/2021 10:45 AM   Virtual Visit via Video Note   I, Desiree Berger, connected with  Desiree Berger  ([id number], 1996-09-02) on 07/09/21 at 10:30 AM EDT by a video-enabled telemedicine application and verified that I am speaking with the correct person using two identifiers.  Location: Patient: Virtual Visit Location Patient: Home Provider: Virtual Visit Location Provider: Home Office   I discussed the limitations of  evaluation and management by telemedicine and the availability of in person appointments. The patient expressed understanding and agreed to proceed.    History of Present Illness: Desiree Berger is a 25 y.o. who identifies as a female who was assigned female at birth, and is being seen today for continued URI symptoms. Was initially evaluated on 07/07/2021 at [facility]. Had 4-5 days of symptoms at time of presentation. COVID 1 month before. Repeat COVID test was obtained to be cautious and negative. Was started on supportive measures and given script for promethazine-dm cough syrup. Notes continued cough now with productive cough that has change coloration in past couple of days. No fever. Denies shortness of breath. Some chest wall tenderness with coughing. Notes continued nasal congestion although slightly improved. Denies sinus pain, ear pain or tooth pain.   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/08/2017   MDD (major depressive disorder), single episode, severe (HCC) 01/16/2014   GAD (generalized anxiety disorder) 01/16/2014   Closed fracture of left toe 04/14/2012    Allergies: No Known Allergies Medications:  Current Outpatient Medications:    azithromycin (ZITHROMAX) 250 MG tablet, Take  2 tablets on day 1, then 1 tablet daily on days 2 through 5, Disp: 6 tablet, Rfl: 0   albuterol (VENTOLIN HFA) 108 (90 Base) MCG/ACT inhaler, Inhale 2 puffs into the lungs every 6 (six) hours as needed for wheezing or shortness of breath., Disp: 8 g, Rfl: 0   etonogestrel (NEXPLANON) 68 MG IMPL implant, 1 each once by Subdermal route., Disp: , Rfl:    fluticasone (FLONASE) 50 MCG/ACT nasal spray, Place 2 sprays into both nostrils  daily., Disp: 15 mL, Rfl: 2   promethazine-dextromethorphan (PROMETHAZINE-DM) 6.25-15 MG/5ML syrup, Take 5 mLs by mouth 4 (four) times daily as needed for cough., Disp: 118 mL, Rfl: 0  Observations/Objective: Patient is well-developed, well-nourished in no acute distress.  Resting comfortably at home.  Head is normocephalic, atraumatic.  No labored breathing. Speech is clear and coherent with logical content.  Patient is alert and oriented at baseline.   Assessment and Plan: 1. Acute bacterial bronchitis - azithromycin (ZITHROMAX) 250 MG tablet; Take 2 tablets on day 1, then 1 tablet daily on days 2 through 5  Dispense: 6 tablet; Refill: 0 Rx Azithromycin.  Increase fluids.  Rest.  Saline nasal spray.  Probiotic.  Mucinex as directed.  Humidifier in bedroom. Continue prescription cough syrup.  If anything worsens or new symptoms develop, she will need repeat in-office evaluation.   Follow Up Instructions: I discussed the assessment and treatment plan with the patient. The patient was provided an opportunity to ask questions and all were answered. The patient agreed with the plan and demonstrated an understanding of the instructions.  A copy of instructions were sent to the patient via MyChart.  The patient was advised to call back or seek an in-person evaluation if the symptoms worsen or if the condition fails to improve as anticipated.  Time:  I spent 12 minutes with the patient via telehealth technology discussing the above problems/concerns.    Desiree Climes, PA-C

## 2021-11-18 ENCOUNTER — Telehealth: Payer: Self-pay | Admitting: Family Medicine

## 2021-11-18 ENCOUNTER — Telehealth: Payer: Self-pay

## 2021-11-18 DIAGNOSIS — G44209 Tension-type headache, unspecified, not intractable: Secondary | ICD-10-CM

## 2021-11-18 MED ORDER — IBUPROFEN 600 MG PO TABS: 600.0000 mg | ORAL_TABLET | Freq: Three times a day (TID) | ORAL | 0 refills | Status: DC | PRN

## 2021-11-18 NOTE — Progress Notes (Signed)
Virtual Visit Consent   Desiree Berger, you are scheduled for a virtual visit with a [place] provider today.     Just as with appointments in the office, your consent must be obtained to participate.  Your consent will be active for this visit and any virtual visit you may have with one of our providers in the next 365 days.     If you have a MyChart account, a copy of this consent can be sent to you electronically.  All virtual visits are billed to your insurance company just like a traditional visit in the office.    As this is a virtual visit, video technology does not allow for your provider to perform a traditional examination.  This may limit your provider's ability to fully assess your condition.  If your provider identifies any concerns that need to be evaluated in person or the need to arrange testing (such as labs, EKG, etc.), we will make arrangements to do so.     Although advances in technology are sophisticated, we cannot ensure that it will always work on either your end or our end.  If the connection with a video visit is poor, the visit may have to be switched to a telephone visit.  With either a video or telephone visit, we are not always able to ensure that we have a secure connection.     I need to obtain your verbal consent now.   Are you willing to proceed with your visit today?    Desiree Berger has provided verbal consent on 11/18/2021 for a virtual visit (video or telephone).   Desiree Mayo, NP   Date: 11/18/2021 12:35 PM   Virtual Visit via Video Note   I, Desiree Berger, connected with  Desiree Berger  ([id number], 28-May-1996) on 11/18/21 at 12:30 PM EST by a video-enabled telemedicine application and verified that I am speaking with the correct person using two identifiers.  Location: Patient: Virtual Visit Location Patient: Home Provider: Virtual Visit Location Provider: Home Office   I discussed the limitations of  evaluation and management by telemedicine and the availability of in person appointments. The patient expressed understanding and agreed to proceed.    History of Present Illness: Desiree Berger is a 26 y.o. who identifies as a female who was assigned female at birth, and is being seen today for headache.   HPI: Headache  This is a recurrent problem. The current episode started yesterday. The problem occurs constantly. The problem has been unchanged. The pain is located in the Occipital region. The pain radiates to the left neck and right neck. The pain quality is similar to prior headaches. The quality of the pain is described as dull and aching. The pain is at a severity of 5/10. The pain is moderate. Associated symptoms include eye pain, neck pain, phonophobia and photophobia. Pertinent negatives include no abdominal pain, abnormal behavior, anorexia, back pain, blurred vision, eye redness, eye watering, fever, nausea, rhinorrhea, sinus pressure, sore throat, visual change or vomiting. The symptoms are aggravated by bright light and work. She has tried NSAIDs for the symptoms. The treatment provided mild relief.   Problems:  Patient Active Problem List   Diagnosis Date Noted   Wrist sprain, right, initial encounter 05/31/2020   Schizoaffective disorder, bipolar type ([place])    Substance induced mood disorder ([place]) 09/08/2017   MDD (major depressive disorder), recurrent, severe, with psychosis ([place]) 09/08/2017   MDD (major depressive disorder), single  episode, severe ([place]) 01/16/2014   GAD (generalized anxiety disorder) 01/16/2014   Closed fracture of left toe 04/14/2012    Allergies: No Known Allergies Medications:  Current Outpatient Medications:    albuterol (VENTOLIN HFA) 108 (90 Base) MCG/ACT inhaler, Inhale 2 puffs into the lungs every 6 (six) hours as needed for wheezing or shortness of breath., Disp: 8 g, Rfl: 0   etonogestrel (NEXPLANON) 68 MG IMPL implant, 1 each once by  Subdermal route., Disp: , Rfl:    fluticasone (FLONASE) 50 MCG/ACT nasal spray, Place 2 sprays into both nostrils daily., Disp: 15 mL, Rfl: 2   promethazine-dextromethorphan (PROMETHAZINE-DM) 6.25-15 MG/5ML syrup, Take 5 mLs by mouth 4 (four) times daily as needed for cough., Disp: 118 mL, Rfl: 0  Observations/Objective: Patient is well-developed, well-nourished in no acute distress.  Resting comfortably  at home.  Head is normocephalic, atraumatic.  No labored breathing.  Speech is clear and coherent with logical content.  Patient is alert and oriented at baseline.    Assessment and Plan:  1. Tension-type headache, not intractable, unspecified chronicity pattern  Most likely hormonal or tension related HA Advised follow up in person OTC measures reviewed    Reviewed side effects, risks and benefits of medication.    Patient acknowledged agreement and understanding of the plan.    - ibuprofen (ADVIL) 600 MG tablet; Take 1 tablet (600 mg total) by mouth every 8 (eight) hours as needed.  Dispense: 30 tablet; Refill: 0     Follow Up Instructions: I discussed the assessment and treatment plan with the patient. The patient was provided an opportunity to ask questions and all were answered. The patient agreed with the plan and demonstrated an understanding of the instructions.  A copy of instructions were sent to the patient via MyChart unless otherwise noted below.    The patient was advised to call back or seek an in-person evaluation if the symptoms worsen or if the condition fails to improve as anticipated.  Time:  I spent 10 minutes with the patient via telehealth technology discussing the above problems/concerns.    Desiree Mayo, NP

## 2021-11-18 NOTE — Patient Instructions (Signed)
General Headache Without Cause A headache is pain or discomfort you feel around the head or neck area. There are many causes and types of headaches. In some cases, the cause may not be found. Follow these instructions at home: Watch your condition for any changes. Let your doctor know about them. Take these steps to help with your condition: Managing pain   Take over-the-counter and prescription medicines only as told by your doctor. This includes medicines for pain that are taken by mouth or put on the skin. Lie down in a dark, quiet room when you have a headache. If told, put ice on your head and neck area: Put ice in a plastic bag. Place a towel between your skin and the bag. Leave the ice on for 20 minutes, 2-3 times per day. Take off the ice if your skin turns bright red. This is very important. If you cannot feel pain, heat, or cold, you have a greater risk of damage to the area. If told, put heat on the affected area. Use the heat source that your doctor recommends, such as a moist heat pack or a heating pad. Place a towel between your skin and the heat source. Leave the heat on for 20-30 minutes. Take off the heat if your skin turns bright red. This is very important. If you cannot feel pain, heat, or cold, you have a greater risk of getting burned. Keep lights dim if bright lights bother you or make your headaches worse. Eating and drinking Eat meals on a regular schedule. If you drink alcohol: Limit how much you have to: 0-1 drink a day for women who are not pregnant. 0-2 drinks a day for men. Know how much alcohol is in a drink. In the [place] one drink equals one 12 oz bottle of beer (355 mL), one 5 oz glass of wine (148 mL), or one 1 oz glass of hard liquor (44 mL). Stop drinking caffeine, or drink less caffeine. General instructions  Keep a journal to find out if certain things bring on headaches. For example, write down: What you eat and drink. How much sleep you get. Any  change to your diet or medicines. Get a massage or try other ways to relax. Limit stress. Sit up straight. Do not tighten (tense) your muscles. Do not smoke or use any products that contain nicotine or tobacco. If you need help quitting, ask your doctor. Exercise regularly as told by your doctor. Get enough sleep. This often means 7-9 hours of sleep each night. Keep all follow-up visits. This is important. Contact a doctor if: Medicine does not help your symptoms. You have a headache that feels different than the other headaches. You feel like you may vomit (nauseous) or you vomit. You have a fever. Get help right away if: Your headache: Gets very bad quickly. Gets worse after a lot of physical activity. You have any of these symptoms: You continue to vomit. A stiff neck. Trouble seeing. Your eye or ear hurts. Trouble speaking. Weak muscles or you lose muscle control. You lose your balance or have trouble walking. You feel like you will pass out (faint) or you pass out. You are mixed up (confused). You have a seizure. These symptoms may be an emergency. Get help right away. Call your local emergency services (911 in the [place] Do not wait to see if the symptoms will go away. Do not drive yourself to the hospital. Summary A headache is pain or discomfort that is felt   around the head or neck area. There are many causes and types of headaches. In some cases, the cause may not be found. Keep a journal to help find out what causes your headaches. Watch your condition for any changes. Let your doctor know about them. Contact a doctor if you have a headache that is different from usual, or if medicine does not help your headache. Get help right away if your headache gets very bad, you throw up, you have trouble seeing, you lose your balance, or you have a seizure. This information is not intended to replace advice given to you by your health care provider. Make sure you discuss any  questions you have with your health care provider. Document Revised: 03/19/2021 Document Reviewed: 03/19/2021 Elsevier Patient Education  2022 [employer]  

## 2021-12-31 ENCOUNTER — Telehealth: Payer: Self-pay | Admitting: Physician Assistant

## 2021-12-31 DIAGNOSIS — J069 Acute upper respiratory infection, unspecified: Secondary | ICD-10-CM

## 2021-12-31 DIAGNOSIS — R112 Nausea with vomiting, unspecified: Secondary | ICD-10-CM

## 2021-12-31 MED ORDER — PSEUDOEPH-BROMPHEN-DM 30-2-10 MG/5ML PO SYRP: 5.0000 mL | ORAL_SOLUTION | Freq: Four times a day (QID) | ORAL | 0 refills | Status: DC | PRN

## 2021-12-31 MED ORDER — IPRATROPIUM BROMIDE 0.03 % NA SOLN: 2.0000 | Freq: Two times a day (BID) | NASAL | 0 refills | Status: DC

## 2021-12-31 MED ORDER — ONDANSETRON HCL 4 MG PO TABS: 4.0000 mg | ORAL_TABLET | Freq: Three times a day (TID) | ORAL | 0 refills | Status: DC | PRN

## 2021-12-31 NOTE — Patient Instructions (Addendum)
Desiree Berger, thank you for joining Margaretann Loveless, PA-C for today's virtual visit.  While this provider is not your primary care provider (PCP), if your PCP is located in our provider database this encounter information will be shared with them immediately following your visit.  Consent: (Patient) Desiree Berger provided verbal consent for this virtual visit at the beginning of the encounter.  Current Medications:  Current Outpatient Medications:    brompheniramine-pseudoephedrine-DM 30-2-10 MG/5ML syrup, Take 5 mLs by mouth 4 (four) times daily as needed., Disp: 120 mL, Rfl: 0   ipratropium (ATROVENT) 0.03 % nasal spray, Place 2 sprays into both nostrils every 12 (twelve) hours., Disp: 30 mL, Rfl: 0   ondansetron (ZOFRAN) 4 MG tablet, Take 1 tablet (4 mg total) by mouth every 8 (eight) hours as needed for nausea or vomiting., Disp: 20 tablet, Rfl: 0   albuterol (VENTOLIN HFA) 108 (90 Base) MCG/ACT inhaler, Inhale 2 puffs into the lungs every 6 (six) hours as needed for wheezing or shortness of breath., Disp: 8 g, Rfl: 0   etonogestrel (NEXPLANON) 68 MG IMPL implant, 1 each once by Subdermal route., Disp: , Rfl:    fluticasone (FLONASE) 50 MCG/ACT nasal spray, Place 2 sprays into both nostrils daily., Disp: 15 mL, Rfl: 2   ibuprofen (ADVIL) 600 MG tablet, Take 1 tablet (600 mg total) by mouth every 8 (eight) hours as needed., Disp: 30 tablet, Rfl: 0   Medications ordered in this encounter:  Meds ordered this encounter  Medications   ondansetron (ZOFRAN) 4 MG tablet    Sig: Take 1 tablet (4 mg total) by mouth every 8 (eight) hours as needed for nausea or vomiting.    Dispense:  20 tablet    Refill:  0    Order Specific Question:   Supervising Provider    Answer:   Hyacinth Meeker, BRIAN [[id number]]   brompheniramine-pseudoephedrine-DM 30-2-10 MG/5ML syrup    Sig: Take 5 mLs by mouth 4 (four) times daily as needed.    Dispense:  120 mL    Refill:  0    Order Specific  Question:   Supervising Provider    Answer:   MILLER, BRIAN [[id number]]   ipratropium (ATROVENT) 0.03 % nasal spray    Sig: Place 2 sprays into both nostrils every 12 (twelve) hours.    Dispense:  30 mL    Refill:  0    Order Specific Question:   Supervising Provider    Answer:   Hyacinth Meeker, BRIAN [[id number]]     *If you need refills on other medications prior to your next appointment, please contact your pharmacy*  Follow-Up: Call back or seek an in-person evaluation if the symptoms worsen or if the condition fails to improve as anticipated.  Other Instructions [facility] Billing: [phone] or [phone]  Viral Respiratory Infection A respiratory infection is an illness that affects part of the respiratory system, such as the lungs, nose, or throat. A respiratory infection that is caused by a virus is called a viral respiratory infection. Common types of viral respiratory infections include: A cold. The flu (influenza). A respiratory syncytial virus (RSV) infection. What are the causes? This condition is caused by a virus. The virus may spread through contact with droplets or direct contact with infected people or their mucus or secretions. The virus may spread from person to person (is contagious). What are the signs or symptoms? Symptoms of this condition include: A stuffy or runny nose. A sore throat or cough. Shortness of  breath or difficulty breathing. Yellow or green mucus (sputum). Other symptoms may include: A fever. Sweating or chills. Fatigue. Achy muscles. A headache. How is this diagnosed? This condition may be diagnosed based on: Your symptoms. A physical exam. Testing of secretions from the nose or throat. Chest X-ray. How is this treated? This condition may be treated with medicines, such as: Antiviral medicine. This may shorten the length of time a person has symptoms. Expectorants. These make it easier to cough up mucus. Decongestant nasal  sprays. Acetaminophen or NSAIDs, such as ibuprofen, to relieve fever and pain. Antibiotic medicines are not prescribed for viral infections.This is because antibiotics are designed to kill bacteria. They do not kill viruses. Follow these instructions at home: Managing pain and congestion Take over-the-counter and prescription medicines only as told by your health care provider. If you have a sore throat, gargle with a mixture of salt and water 3-4 times a day or as needed. To make salt water, completely dissolve -1 tsp (3-6 g) of salt in 1 cup (237 mL) of warm water. Use nose drops made from salt water to ease congestion and soften raw skin around your nose. Take 2 tsp (10 mL) of honey at bedtime to lessen coughing at night. Do not give honey to children who are younger than 1 year. Drink enough fluid to keep your urine pale yellow. This helps prevent dehydration and helps loosen up mucus. General instructions  Rest as much as possible. Do not drink alcohol. Do not use any products that contain nicotine or tobacco. These products include cigarettes, chewing tobacco, and vaping devices, such as e-cigarettes. If you need help quitting, ask your health care provider. Keep all follow-up visits. This is important. How is this prevented?   Get an annual flu shot. You may get the flu shot in late summer, fall, or winter. Ask your health care provider when you should get your flu shot. Avoid spreading your infection to other people. If you are sick: Wash your hands with soap and water often, especially after you cough or sneeze. Wash for at least 20 seconds. If soap and water are not available, use alcohol-based hand sanitizer. Cover your mouth when you cough. Cover your nose and mouth when you sneeze. Do not share cups or eating utensils. Clean commonly used objects often. Clean commonly touched surfaces. Stay home from work or school as told by your health care provider. Avoid contact with  people who are sick during cold and flu season. This is generally fall and winter. Contact a health care provider if: Your symptoms last for 10 days or longer. Your symptoms get worse over time. You have severe sinus pain in your face or forehead. The glands in your jaw or neck become very swollen. You have shortness of breath. Get help right away if you: Feel pain or pressure in your chest. Have trouble breathing. Faint or feel like you will faint. Have severe and persistent vomiting. Feel confused or disoriented. These symptoms may represent a serious problem that is an emergency. Do not wait to see if the symptoms will go away. Get medical help right away. Call your local emergency services (911 in the [place] Do not drive yourself to the hospital. Summary A respiratory infection is an illness that affects part of the respiratory system, such as the lungs, nose, or throat. A respiratory infection that is caused by a virus is called a viral respiratory infection. Common types of viral respiratory infections include a cold, influenza,  and respiratory syncytial virus (RSV) infection. Symptoms of this condition include a stuffy or runny nose, cough, fatigue, achy muscles, sore throat, and fevers or chills. Antibiotic medicines are not prescribed for viral infections. This is because antibiotics are designed to kill bacteria. They are not effective against viruses. This information is not intended to replace advice given to you by your health care provider. Make sure you discuss any questions you have with your health care provider. Document Revised: 01/23/2021 Document Reviewed: 01/23/2021 Elsevier Patient Education  2022 [employer].    If you have been instructed to have an in-person evaluation today at a local Urgent Care facility, please use the link below. It will take you to a list of all of our available [facility], including address, phone number and hours of operation.  Please do not delay care.  [facility]  If you or a family member do not have a primary care provider, use the link below to schedule a visit and establish care. When you choose a [facility] primary care physician or advanced practice provider, you gain a long-term partner in health. Find a Primary Care Provider  Learn more about [facility]'s in-office and virtual care options: [facility] - Get Care Now

## 2021-12-31 NOTE — Progress Notes (Signed)
?Virtual Visit Consent  ? ?Desiree Berger, you are scheduled for a virtual visit with a [facility] provider today.   ?  ?Just as with appointments in the office, your consent must be obtained to participate.  Your consent will be active for this visit and any virtual visit you may have with one of our providers in the next 365 days.   ?  ?If you have a MyChart account, a copy of this consent can be sent to you electronically.  All virtual visits are billed to your insurance company just like a traditional visit in the office.   ? ?As this is a virtual visit, video technology does not allow for your provider to perform a traditional examination.  This may limit your provider's ability to fully assess your condition.  If your provider identifies any concerns that need to be evaluated in person or the need to arrange testing (such as labs, EKG, etc.), we will make arrangements to do so.   ?  ?Although advances in technology are sophisticated, we cannot ensure that it will always work on either your end or our end.  If the connection with a video visit is poor, the visit may have to be switched to a telephone visit.  With either a video or telephone visit, we are not always able to ensure that we have a secure connection.    ? ?I need to obtain your verbal consent now.   Are you willing to proceed with your visit today?  ?  ?Desiree Berger has provided verbal consent on 12/31/2021 for a virtual visit (video or telephone). ?  ?Desiree Daring, PA-C  ? ?Date: 12/31/2021 9:20 AM ? ? ?Virtual Visit via Video Note  ? ?IMar Berger, connected with  Desiree Berger  ([id number], Jun 08, 1996) on 12/31/21 at  9:15 AM EST by a video-enabled telemedicine application and verified that I am speaking with the correct person using two identifiers. ? ?Location: ?Patient: Virtual Visit Location Patient: Home ?Provider: Virtual Visit Location Provider: Home Office ?  ?I discussed the limitations of  evaluation and management by telemedicine and the availability of in person appointments. The patient expressed understanding and agreed to proceed.   ? ?History of Present Illness: ?Desiree Berger is a 26 y.o. who identifies as a female who was assigned female at birth, and is being seen today for nausea, chills, diarrhea. ? ?HPI: Diarrhea  ?This is a new problem. The current episode started in the past 7 days (Symptoms started Sunday evening). The stool consistency is described as Watery. The patient states that diarrhea does not awaken her from sleep. Associated symptoms include bloating, chills, coughing, a fever (subjective), headaches, sweats, a URI and vomiting. Pertinent negatives include no myalgias. Associated symptoms comments: Fatigue, malaise. Nothing aggravates the symptoms. Risk factors include ill contacts. She has tried analgesics (advil 600mg , tylenol) for the symptoms. The treatment provided no relief.   ?Had sick exposure, possible covid ? ?Problems:  ?Patient Active Problem List  ? Diagnosis Date Noted  ? Wrist sprain, right, initial encounter 05/31/2020  ? Schizoaffective disorder, bipolar type ([facility])   ? Substance induced mood disorder ([facility]) 09/08/2017  ? 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 MDD (major depressive disorder), single episode, severe ([facility]) 01/16/2014  ? GAD (generalized anxiety disorder) 01/16/2014  ? Closed fracture of left toe 04/14/2012  ?  ?Allergies: No Known Allergies ?Medications:  ?Current Outpatient Medications:  ?  brompheniramine-pseudoephedrine-DM 30-2-10 MG/5ML syrup, Take 5 mLs by mouth 4 (four) times daily as needed., Disp: 120 mL, Rfl: 0 ?  ipratropium (ATROVENT) 0.03 % nasal spray, Place 2 sprays into both nostrils every 12 (twelve) hours., Disp: 30 mL, Rfl: 0 ?  ondansetron (ZOFRAN) 4 MG tablet, Take 1 tablet (4 mg total) by mouth every 8 (eight) hours as needed for nausea or vomiting., Disp: 20 tablet,  Rfl: 0 ?  albuterol (VENTOLIN HFA) 108 (90 Base) MCG/ACT inhaler, Inhale 2 puffs into the lungs every 6 (six) hours as needed for wheezing or shortness of breath., Disp: 8 g, Rfl: 0 ?  etonogestrel (NEXPLANON) 68 MG IMPL implant, 1 each once by Subdermal route., Disp: , Rfl:  ?  fluticasone (FLONASE) 50 MCG/ACT nasal spray, Place 2 sprays into both nostrils daily., Disp: 15 mL, Rfl: 2 ?  ibuprofen (ADVIL) 600 MG tablet, Take 1 tablet (600 mg total) by mouth every 8 (eight) hours as needed., Disp: 30 tablet, Rfl: 0 ? ?Observations/Objective: ?Patient is well-developed, well-nourished in no acute distress.  ?Resting comfortably at home.  ?Head is normocephalic, atraumatic.  ?No labored breathing.  ?Speech is clear and coherent with logical content.  ?Patient is alert and oriented at baseline.  ? ? ?Assessment and Plan: ?1. Viral URI with cough ?- brompheniramine-pseudoephedrine-DM 30-2-10 MG/5ML syrup; Take 5 mLs by mouth 4 (four) times daily as needed.  Dispense: 120 mL; Refill: 0 ?- ipratropium (ATROVENT) 0.03 % nasal spray; Place 2 sprays into both nostrils every 12 (twelve) hours.  Dispense: 30 mL; Refill: 0 ? ?2. Nausea and vomiting, unspecified vomiting type ?- ondansetron (ZOFRAN) 4 MG tablet; Take 1 tablet (4 mg total) by mouth every 8 (eight) hours as needed for nausea or vomiting.  Dispense: 20 tablet; Refill: 0 ? ?- Possible Covid exposure, not tested; advised to test ?- Will prescribe Zofran, Bromfed DM, and Ipratropium for symptoms ?- Continue OTC medications of choice for symptom management ?- Push fluids ?- Rest ?- Seek in person evaluation if symptoms persist or worsen ? ?Follow Up Instructions: ?I discussed the assessment and treatment plan with the patient. The patient was provided an opportunity to ask questions and all were answered. The patient agreed with the plan and demonstrated an understanding of the instructions.  A copy of instructions were sent to the patient via MyChart unless otherwise  noted below.  ? ? ?The patient was advised to call back or seek an in-person evaluation if the symptoms worsen or if the condition fails to improve as anticipated. ? ?Time:  ?I spent 15 minutes with the patient via telehealth technology discussing the above problems/concerns.   ? ?Desiree Daring, PA-C ?

## 2022-02-16 ENCOUNTER — Ambulatory Visit
Admission: EM | Admit: 2022-02-16 | Discharge: 2022-02-16 | Disposition: A | Discharge status: Other Acute Inpatient Hospital | Payer: Self-pay | Attending: Internal Medicine | Admitting: Internal Medicine

## 2022-02-16 ENCOUNTER — Ambulatory Visit (HOSPITAL_COMMUNITY): Admission: RE | Admit: 2022-02-16 | Payer: Self-pay | Source: Ambulatory Visit

## 2022-02-16 ENCOUNTER — Other Ambulatory Visit: Payer: Self-pay

## 2022-02-16 ENCOUNTER — Encounter: Payer: Self-pay | Admitting: Emergency Medicine

## 2022-02-16 DIAGNOSIS — R519 Headache, unspecified: Secondary | ICD-10-CM

## 2022-02-16 DIAGNOSIS — R21 Rash and other nonspecific skin eruption: Secondary | ICD-10-CM

## 2022-02-16 NOTE — Discharge Instructions (Signed)
Please go to the hospital as soon as you leave urgent care for further evaluation and management. 

## 2022-02-16 NOTE — ED Triage Notes (Signed)
Patient is requesting a COVID test due to a rash popping up on her neck yesterday and having headaches.  Patient has been taken Tylenol and Benadryl. ?

## 2022-02-16 NOTE — ED Provider Notes (Addendum)
?[facility] ? ? ? ?CSN: [id number] ?Arrival date & time: 02/16/22  1515 ? ? ?  ? ?History   ?Chief Complaint ?Chief Complaint  ?Patient presents with  ? Rash  ? ? ?HPI ?Desiree Berger is a 26 y.o. female.  ? ?Patient presents due to complaints of rash and headaches.  Patient had an itchy rash yesterday that was present to posterior neck that lasted for approximately 20 minutes and then resolved with Benadryl.  Denies changes to the environment including lotions, soaps, detergents, foods, etc.  Patient has not seen any other rashes since.  Patient also complaining of intermittent headaches that have been present for approximately 2 weeks.  Headache is present in the posterior head and wraps around head.  She has associated dizziness, blurred vision, confusion, photophobia.  When asked to elaborate on confusion, patient reports that she cannot remember how to do simple tasks at times.  Patient has taken Tylenol for headache with minimal improvement.  Denies current headache.  Patient does have history of chronic headaches but states that this "feels different".  Denies chest pain or shortness of breath. Patient requesting Covid 19 test. ? ? ?Rash ? ?Past Medical History:  ?Diagnosis Date  ? Asthma   ? Seasonal allergies   ? ? ?Patient Active Problem List  ? Diagnosis Date Noted  ? Wrist sprain, right, initial encounter 05/31/2020  ? Schizoaffective disorder, bipolar type ([facility])   ? Substance induced mood disorder ([facility]) 09/08/2017  ? 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 MDD (major depressive disorder), single episode, severe ([facility]) 01/16/2014  ? GAD (generalized anxiety disorder) 01/16/2014  ? Closed fracture of left toe 04/14/2012  ? ? ?Past Surgical History:  ?Procedure Laterality Date  ? ADENOIDECTOMY    ? TONSILLECTOMY    ? ? ?OB History   ?No obstetric history on file. ?  ? ? ? ?Home Medications   ? ?Prior to Admission medications   ?Medication  Sig Start Date End Date Taking? Authorizing Provider  ?albuterol (VENTOLIN HFA) 108 (90 Base) MCG/ACT inhaler Inhale 2 puffs into the lungs every 6 (six) hours as needed for wheezing or shortness of breath. 08/20/20  Yes Couture, Cortni S, PA-C  ?etonogestrel (NEXPLANON) 68 MG IMPL implant 1 each once by Subdermal route.   Yes [provider]  ?ibuprofen (ADVIL) 600 MG tablet Take 1 tablet (600 mg total) by mouth every 8 (eight) hours as needed. 11/18/21  Yes Freddy FinnerMills, Hannah M, NP  ?brompheniramine-pseudoephedrine-DM 30-2-10 MG/5ML syrup Take 5 mLs by mouth 4 (four) times daily as needed. 12/31/21   Margaretann LovelessBurnette, Jennifer M, PA-C  ?fluticasone (FLONASE) 50 MCG/ACT nasal spray Place 2 sprays into both nostrils daily. 07/07/21   Rhys MartiniGraham, Laura E, PA-C  ?ipratropium (ATROVENT) 0.03 % nasal spray Place 2 sprays into both nostrils every 12 (twelve) hours. 12/31/21   Margaretann LovelessBurnette, Jennifer M, PA-C  ?ondansetron (ZOFRAN) 4 MG tablet Take 1 tablet (4 mg total) by mouth every 8 (eight) hours as needed for nausea or vomiting. 12/31/21   Margaretann LovelessBurnette, Jennifer M, PA-C  ? ? ?Family History ?Family History  ?Problem Relation Age of Onset  ? Hypertension Mother   ? Mental illness Mother   ? Hyperlipidemia Father   ? Birth defects Maternal Grandmother   ? Heart attack Neg Hx   ? Diabetes Neg Hx   ? Sudden death Neg Hx   ? ? ?Social History ?Social History  ? ?Tobacco Use  ? Smoking status:  Some Days  ?  Types: Cigarettes  ? Smokeless tobacco: Never  ? Tobacco comments:  ?  2 cigarettes a day  ?Vaping Use  ? Vaping Use: Every day  ?Substance Use Topics  ? Alcohol use: No  ? Drug use: Yes  ?  Types: Cocaine, Marijuana  ?  Comment: previously not currently using  ? ? ? ?Allergies   ?Patient has no known allergies. ? ? ?Review of Systems ?Review of Systems ?Per HPI ? ?Physical Exam ?Triage Vital Signs ?ED Triage Vitals  ?Enc Vitals Group  ?   BP 02/16/22 1657 130/85  ?   Pulse Rate 02/16/22 1657 73  ?   Resp 02/16/22 1657 18  ?   Temp 02/16/22 1657  98.3 ?F (36.8 ?C)  ?   Temp Source 02/16/22 1657 Oral  ?   SpO2 02/16/22 1657 99 %  ?   Weight 02/16/22 1658 200 lb (90.7 kg)  ?   Height 02/16/22 1658 5\' 6"  (1.676 m)  ?   Head Circumference --   ?   Peak Flow --   ?   Pain Score 02/16/22 1658 5  ?   Pain Loc --   ?   Pain Edu? --   ?   Excl. in GC? --   ? ?No data found. ? ?Updated Vital Signs ?BP 130/85 (BP Location: Right Arm)   Pulse 73   Temp 98.3 ?F (36.8 ?C) (Oral)   Resp 18   Ht 5\' 6"  (1.676 m)   Wt 200 lb (90.7 kg)   SpO2 99%   BMI 32.28 kg/m?  ? ?Visual Acuity ?Right Eye Distance:   ?Left Eye Distance:   ?Bilateral Distance:   ? ?Right Eye Near:   ?Left Eye Near:    ?Bilateral Near:    ? ?Physical Exam ?Constitutional:   ?   General: She is not in acute distress. ?   Appearance: Normal appearance. She is not toxic-appearing or diaphoretic.  ?HENT:  ?   Head: Normocephalic and atraumatic.  ?Eyes:  ?   Extraocular Movements: Extraocular movements intact.  ?   Conjunctiva/sclera: Conjunctivae normal.  ?Cardiovascular:  ?   Rate and Rhythm: Normal rate and regular rhythm.  ?   Pulses: Normal pulses.  ?   Heart sounds: Normal heart sounds.  ?Pulmonary:  ?   Effort: Pulmonary effort is normal. No respiratory distress.  ?   Breath sounds: Normal breath sounds.  ?Skin: ?   Comments: No rash present to posterior neck or any other skin.   ?Neurological:  ?   General: No focal deficit present.  ?   Mental Status: She is alert and oriented to person, place, and time. Mental status is at baseline.  ?   Cranial Nerves: Cranial nerves 2-12 are intact.  ?   Sensory: Sensation is intact.  ?   Motor: Motor function is intact.  ?   Coordination: Coordination is intact.  ?   Gait: Gait is intact.  ?Psychiatric:     ?   Mood and Affect: Mood normal.     ?   Behavior: Behavior normal.     ?   Thought Content: Thought content normal.     ?   Judgment: Judgment normal.  ? ? ? ?UC Treatments / Results  ?Labs ?(all labs ordered are listed, but only abnormal results are  displayed) ?Labs Reviewed  ?NOVEL CORONAVIRUS, NAA  ? ? ?EKG ? ? ?Radiology ?No results found. ? ?Procedures ?Procedures (  including critical care time) ? ?Medications Ordered in UC ?Medications - No data to display ? ?Initial Impression / Assessment and Plan / UC Course  ?I have reviewed the triage vital signs and the nursing notes. ? ?Pertinent labs & imaging results that were available during my care of the patient were reviewed by me and considered in my medical decision making (see chart for details). ? ?  ? ?There is no rash present to evaluate or determine etiology.  Patient advised to monitor and to follow-up if symptoms return. ? ?Patient does have history of chronic headaches, but patient reports that this feels different and she has associated confusion which is new.  Due to this, I do think this warrants CT imaging of the head which cannot be provided at urgent care.  Patient was advised to go to the ER for further evaluation and management of this.  Patient was agreeable with plan.  Vital signs and neuro exam stable at discharge.  Patient left via self transport. ?Final Clinical Impressions(s) / UC Diagnoses  ? ?Final diagnoses:  ?Acute nonintractable headache, unspecified headache type  ?Rash and nonspecific skin eruption  ? ? ? ?Discharge Instructions   ? ?  ?Please go to the hospital as soon as you leave urgent care for further evaluation and management. ? ? ? ?ED Prescriptions   ?None ?  ? ?PDMP not reviewed this encounter. ?  ?Gustavus Bryant, [place] ?02/16/22 1732 ? ?  ?Gustavus Bryant, [place] ?02/16/22 1733 ? ?

## 2022-02-16 NOTE — ED Notes (Signed)
Patient is being discharged from the Urgent Care and sent to the Emergency Department via private vehicle . Per Ervin Knack, NP patient is in need of higher level of care due to further evaluation. Patient is aware and verbalizes understanding of plan of care.  ?Vitals:  ? 02/16/22 1657  ?BP: 130/85  ?Pulse: 73  ?Resp: 18  ?Temp: 98.3 ?F (36.8 ?C)  ?SpO2: 99%  ?  ?

## 2022-02-17 ENCOUNTER — Emergency Department (HOSPITAL_COMMUNITY): Payer: Self-pay

## 2022-02-17 ENCOUNTER — Other Ambulatory Visit: Payer: Self-pay

## 2022-02-17 ENCOUNTER — Emergency Department (HOSPITAL_COMMUNITY)
Admission: EM | Admit: 2022-02-17 | Discharge: 2022-02-17 | Disposition: A | Discharge status: Home / Self Care | Payer: Self-pay | Attending: Emergency Medicine | Admitting: Emergency Medicine

## 2022-02-17 ENCOUNTER — Encounter (HOSPITAL_COMMUNITY): Payer: Self-pay

## 2022-02-17 DIAGNOSIS — Q0700 Arnold-Chiari syndrome without spina bifida or hydrocephalus: Secondary | ICD-10-CM | POA: Insufficient documentation

## 2022-02-17 DIAGNOSIS — R519 Headache, unspecified: Secondary | ICD-10-CM | POA: Insufficient documentation

## 2022-02-17 DIAGNOSIS — G935 Compression of brain: Secondary | ICD-10-CM

## 2022-02-17 LAB — NOVEL CORONAVIRUS, NAA: SARS-CoV-2, NAA: NOT DETECTED

## 2022-02-17 MED ORDER — DIPHENHYDRAMINE HCL 25 MG PO CAPS
25.0000 mg | ORAL_CAPSULE | Freq: Once | ORAL | Status: AC
  Administered 2022-02-17: 25 mg via ORAL
  Filled 2022-02-17: qty 1

## 2022-02-17 MED ORDER — PROCHLORPERAZINE MALEATE 10 MG PO TABS
10.0000 mg | ORAL_TABLET | Freq: Once | ORAL | Status: AC
  Administered 2022-02-17: 10 mg via ORAL
  Filled 2022-02-17: qty 1

## 2022-02-17 MED ORDER — PROCHLORPERAZINE EDISYLATE 10 MG/2ML IJ SOLN
10.0000 mg | Freq: Once | INTRAMUSCULAR | Status: DC
  Filled 2022-02-17: qty 2

## 2022-02-17 MED ORDER — KETOROLAC TROMETHAMINE 60 MG/2ML IM SOLN
60.0000 mg | Freq: Once | INTRAMUSCULAR | Status: DC
  Filled 2022-02-17: qty 2

## 2022-02-17 MED ORDER — DIPHENHYDRAMINE HCL 50 MG/ML IJ SOLN
25.0000 mg | Freq: Once | INTRAMUSCULAR | Status: DC
  Filled 2022-02-17: qty 1

## 2022-02-17 NOTE — ED Provider Notes (Signed)
?[facility]-EMERGENCY DEPT ?Provider Note ? ? ?CSN: [id number] ?Arrival date & time: 02/17/22  1308 ? ?  ? ?History ? ?Chief Complaint  ?Patient presents with  ? vision issues  ? auditory issues  ? Headache  ? ? ?Desiree Berger is a 26 y.o. female. ? ?HPI ?26 year old female who presented to the ER from urgent care with complaints of headaches.  Headaches have been ongoing for the last year, though she feels as they have been coming on almost daily.  She reports flashing lights as these headaches are coming on, with occasional dizziness, blurry vision.  She also endorses some photophobia and hyperacusis.  She endorses increased forgetfulness and states that she sometimes cannot function at work as she cannot remember how to do simple tasks.  She takes ibuprofen with some improvement.  She endorses a headache at present.  She denies any fevers or chills.  No neck stiffness, but she states that she does have some pain near the bottom of her head.  She denies any inability to move her neck.  She complained of the symptoms to urgent care and was sent for further evaluation.  She denies any falls or head injuries.  No thunderclap characteristic. ?  ? ?Home Medications ?Prior to Admission medications   ?Medication Sig Start Date End Date Taking? Authorizing Provider  ?albuterol (VENTOLIN HFA) 108 (90 Base) MCG/ACT inhaler Inhale 2 puffs into the lungs every 6 (six) hours as needed for wheezing or shortness of breath. 08/20/20   Couture, Cortni S, PA-C  ?brompheniramine-pseudoephedrine-DM 30-2-10 MG/5ML syrup Take 5 mLs by mouth 4 (four) times daily as needed. 12/31/21   Margaretann Loveless, PA-C  ?etonogestrel (NEXPLANON) 68 MG IMPL implant 1 each once by Subdermal route.    [provider]  ?fluticasone (FLONASE) 50 MCG/ACT nasal spray Place 2 sprays into both nostrils daily. 07/07/21   Rhys Martini, PA-C  ?ibuprofen (ADVIL) 600 MG tablet Take 1 tablet (600 mg total) by mouth every 8  (eight) hours as needed. 11/18/21   Freddy Finner, NP  ?ipratropium (ATROVENT) 0.03 % nasal spray Place 2 sprays into both nostrils every 12 (twelve) hours. 12/31/21   Margaretann Loveless, PA-C  ?ondansetron (ZOFRAN) 4 MG tablet Take 1 tablet (4 mg total) by mouth every 8 (eight) hours as needed for nausea or vomiting. 12/31/21   Margaretann Loveless, PA-C  ?   ? ?Allergies    ?Patient has no known allergies.   ? ?Review of Systems   ?Review of Systems ?Ten systems reviewed and are negative for acute change, except as noted in the HPI. ' ? ?Physical Exam ?Updated Vital Signs ?BP 129/78 (BP Location: Left Arm)   Pulse 66   Temp 98.4 ?F (36.9 ?C) (Oral)   Resp 16   Ht 5\' 6"  (1.676 m)   Wt 90.7 kg   SpO2 97%   BMI 32.28 kg/m?  ?Physical Exam ?Vitals and nursing note reviewed.  ?Constitutional:   ?   General: She is not in acute distress. ?   Appearance: She is well-developed.  ?HENT:  ?   Head: Normocephalic and atraumatic.  ?Eyes:  ?   Conjunctiva/sclera: Conjunctivae normal.  ?Cardiovascular:  ?   Rate and Rhythm: Normal rate and regular rhythm.  ?   Heart sounds: No murmur heard. ?Pulmonary:  ?   Effort: Pulmonary effort is normal. No respiratory distress.  ?   Breath sounds: Normal breath sounds.  ?Abdominal:  ?  Palpations: Abdomen is soft.  ?   Tenderness: There is no abdominal tenderness.  ?Musculoskeletal:     ?   General: No swelling.  ?   Cervical back: Neck supple.  ?Skin: ?   General: Skin is warm and dry.  ?   Capillary Refill: Capillary refill takes less than 2 seconds.  ?Neurological:  ?   Mental Status: She is alert.  ?   Cranial Nerves: No cranial nerve deficit or dysarthria.  ?   Sensory: No sensory deficit.  ?   Motor: No weakness.  ?   Comments: Mental Status:  ?Alert, thought content appropriate, able to give a coherent history. Speech fluent without evidence of aphasia. Able to follow 2 step commands without difficulty.  ?Cranial Nerves:  ?II:  Peripheral visual fields grossly normal, pupils  equal, round, reactive to light ?III,IV, VI: ptosis not present, extra-ocular motions intact bilaterally  ?V,VII: smile symmetric, facial light touch sensation equal ?VIII: hearing grossly normal to voice  ?X: uvula elevates symmetrically  ?XI: bilateral shoulder shrug symmetric and strong ?XII: midline tongue extension without fassiculations ?Motor:  ?Normal tone. 5/5 strength of BUE and BLE major muscle groups including strong and equal grip strength and dorsiflexion/plantar flexion ?Sensory: light touch normal in all extremities. ?Cerebellar: normal finger-to-nose with bilateral upper extremities, Romberg sign absent ?Gait: normal gait and balance. Able to walk on toes and heels with ease.  ? ?  ?Psychiatric:     ?   Mood and Affect: Mood normal.  ? ? ?ED Results / Procedures / Treatments   ?Labs ?(all labs ordered are listed, but only abnormal results are displayed) ?Labs Reviewed - No data to display ? ?EKG ?None ? ?Radiology ?CT Head Wo Contrast ? ?Result Date: 02/17/2022 ?CLINICAL DATA:  Worsening headache. Blurred vision. Inability to concentrate. EXAM: CT HEAD WITHOUT CONTRAST TECHNIQUE: Contiguous axial images were obtained from the base of the skull through the vertex without intravenous contrast. RADIATION DOSE REDUCTION: This exam was performed according to the departmental dose-optimization program which includes automated exposure control, adjustment of the mA and/or kV according to patient size and/or use of iterative reconstruction technique. COMPARISON:  None. FINDINGS: Brain: No evidence of intracranial hemorrhage, acute infarction, hydrocephalus, extra-axial collection, or mass lesion/mass effect. The cerebellar tonsils extend inferiorly through the foramen magnum, consistent with a Chiari 1 malformation. Vascular:  No hyperdense vessel or other acute findings. Skull: No evidence of fracture or other significant bone abnormality. Sinuses/Orbits:  No acute findings. Other: None. IMPRESSION: No  acute intracranial abnormality. Chiari 1 malformation. Brain MRI is recommended for further evaluation. Electronically Signed   By: Danae Orleans M.D.   On: 02/17/2022 10:34   ? ?Procedures ?Procedures  ? ? ?Medications Ordered in ED ?Medications  ?ketorolac (TORADOL) injection 60 mg (has no administration in time range)  ?prochlorperazine (COMPAZINE) tablet 10 mg (10 mg Oral Given 02/17/22 1053)  ?diphenhydrAMINE (BENADRYL) capsule 25 mg (25 mg Oral Given 02/17/22 1053)  ? ? ?ED Course/ Medical Decision Making/ A&P ?  ?                        ?Medical Decision Making ?Amount and/or Complexity of Data Reviewed ?Radiology: ordered. ? ?Risk ?Prescription drug management. ? ? ?26 year old female presenting with headache.  On arrival, she is well-appearing, with no focal neurodeficits, vitals overall reassuring.  Differential diagnosis included migraine with aura, complicated migraine, ICH, brain tumor, stroke, meningitis ? ?Given patient's symptoms,  I did  order CT head to evaluate for rule out of possible brain tumor.  I have low suspicion for meningitis, ICH, stroke given no fever, no neck stiffness, patient is overall well-appearing and her symptoms have been ongoing for a year.  I personally reviewed and interpreted her CT scan, agree with radiology reading.  CT scan does not note any acute intracranial normality but does note a Chiari I malformation.  I discussed these findings with Dr. Conchita ParisNundkumar with neurosurgery, no indication for urgent MRI.  He recommends referral to his office and outpatient follow-up. ? ?I ordered a migraine cocktail with Compazine and Benadryl for the patient, on reevaluation patient notes almost complete resolution of her headache.  We will give an additional dose of Toradol.  I recommended alternating ibuprofen and Tylenol for headaches.  I will also refer her to neurology for further management of what I suspect is migraines.  Educational information Chiari malformation and migraines  provided.  Encouraged increased water intake and decreasing caffeine intake ? ?Social determinants of health include patient is uninsured ? ?Overall, the patient has been medically screened and I believe she is stab

## 2022-02-17 NOTE — Discharge Instructions (Addendum)
You were evaluated in the Emergency Department and after careful evaluation, we did not find any emergent condition requiring admission or further testing in the hospital. ? 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  Please see the handout for more information.  You will need to see Dr. Kathyrn Sheriff with neurosurgery, I have provided his phone number in your discharge paperwork.  Please call this phone number to schedule an appointment.  I will also refer you to neurology for management of migraines.  Please see the handout for more recommendations on how to treat migraines with diet, sleep.  Please call the phone number for [facility] neurology and schedule an appointment.  You may alternate Tylenol and ibuprofen for headaches as well. ? ?Please return to the Emergency Department if you experience any worsening of your condition including worsening vision changes, nausea, vomiting, severe headache, fevers etc.   Thank you for allowing [place] to be a part of your care. ? ?

## 2022-02-17 NOTE — ED Triage Notes (Addendum)
Patient reports that she is seeing flashing colors, headache, and states "when I hear a random noise, I think it is someone talking." Patient states symptoms x 1 year. ?

## 2022-02-17 NOTE — ED Notes (Signed)
I provided reinforced discharge education based off of discharge instructions. Pt acknowledged and understood my education. Pt had no further questions/concerns for provider/myself.  °

## 2022-03-05 ENCOUNTER — Other Ambulatory Visit (HOSPITAL_COMMUNITY): Payer: Self-pay | Admitting: Neurosurgery

## 2022-03-05 ENCOUNTER — Other Ambulatory Visit: Payer: Self-pay | Admitting: Neurosurgery

## 2022-03-05 DIAGNOSIS — G935 Compression of brain: Secondary | ICD-10-CM

## 2022-03-07 ENCOUNTER — Ambulatory Visit (HOSPITAL_COMMUNITY)
Admission: EM | Admit: 2022-03-07 | Discharge: 2022-03-07 | Disposition: A | Discharge status: Home / Self Care | Payer: Self-pay | Attending: Physician Assistant | Admitting: Physician Assistant

## 2022-03-07 ENCOUNTER — Encounter (HOSPITAL_COMMUNITY): Payer: Self-pay

## 2022-03-07 DIAGNOSIS — N898 Other specified noninflammatory disorders of vagina: Secondary | ICD-10-CM | POA: Insufficient documentation

## 2022-03-07 LAB — POC URINE PREG, ED: Preg Test, Ur: NEGATIVE

## 2022-03-07 LAB — POCT URINALYSIS DIPSTICK, ED / UC
Bilirubin Urine: NEGATIVE
Glucose, UA: NEGATIVE mg/dL
Ketones, ur: NEGATIVE mg/dL
Leukocytes,Ua: NEGATIVE
Nitrite: NEGATIVE
Protein, ur: NEGATIVE mg/dL
Specific Gravity, Urine: 1.01 (ref 1.005–1.030)
Urobilinogen, UA: 0.2 mg/dL (ref 0.0–1.0)
pH: 6 (ref 5.0–8.0)

## 2022-03-07 LAB — HIV ANTIBODY (ROUTINE TESTING W REFLEX): HIV Screen 4th Generation wRfx: NONREACTIVE

## 2022-03-07 LAB — HEPATITIS C ANTIBODY: HCV Ab: NONREACTIVE

## 2022-03-07 NOTE — ED Triage Notes (Signed)
Patient reports vaginal discharge for a couple of days.  ?

## 2022-03-07 NOTE — Discharge Instructions (Signed)
Your urine was normal with only some blood but no evidence of infection.  We will contact you with your results if we need to arrange any treatment.  Wear loosefitting cotton underwear and use hypoallergenic soaps and detergents.  If you develop any pelvic pain, severe abdominal pain, fever, nausea, vomiting you should be seen immediately.  If symptoms persist but your testing is negative I recommended follow-up with an OB/GYN; call to schedule an appointment. ?

## 2022-03-07 NOTE — ED Provider Notes (Signed)
?[facility] ? ? ? ?CSN: [id number] ?Arrival date & time: 03/07/22  1603 ? ? ?  ? ?History   ?Chief Complaint ?Chief Complaint  ?Patient presents with  ? Exposure to STD  ?  Entered by patient  ? ? ?HPI ?Desiree Berger is a 26 y.o. female.  ? ?Patient presents today with a year-long history of intermittent lower abdominal pain and vaginal discharge.  She has a history of bacterial vaginosis but states current symptoms are not the same as previous episodes of this condition.  She reports discharge is copious without color but with odor.  She denies any changes to personal hygiene products including soaps or detergents.  She denies any recent antibiotic use.  She denies abdominal pain, pelvic pain, fever, nausea, vomiting.  She is sexually active with female partners and does not consistently use condoms.  Reports that she was in a monogamous relationship but this recently ended and she is interested in complete STI panel. ? ? ?Past Medical History:  ?Diagnosis Date  ? Asthma   ? Seasonal allergies   ? ? ?Patient Active Problem List  ? Diagnosis Date Noted  ? Wrist sprain, right, initial encounter 05/31/2020  ? Schizoaffective disorder, bipolar type (HCC)   ? Substance induced mood disorder (HCC) 09/08/2017  ? 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 MDD (major depressive disorder), single episode, severe (HCC) 01/16/2014  ? GAD (generalized anxiety disorder) 01/16/2014  ? Closed fracture of left toe 04/14/2012  ? ? ?Past Surgical History:  ?Procedure Laterality Date  ? ADENOIDECTOMY    ? TONSILLECTOMY    ? ? ?OB History   ?No obstetric history on file. ?  ? ? ? ?Home Medications   ? ?Prior to Admission medications   ?Medication Sig Start Date End Date Taking? Authorizing Provider  ?albuterol (VENTOLIN HFA) 108 (90 Base) MCG/ACT inhaler Inhale 2 puffs into the lungs every 6 (six) hours as needed for wheezing or shortness of breath. 08/20/20   Couture,  Cortni S, PA-C  ?brompheniramine-pseudoephedrine-DM 30-2-10 MG/5ML syrup Take 5 mLs by mouth 4 (four) times daily as needed. 12/31/21   Margaretann Loveless, PA-C  ?etonogestrel (NEXPLANON) 68 MG IMPL implant 1 each once by Subdermal route.    [provider]  ?fluticasone (FLONASE) 50 MCG/ACT nasal spray Place 2 sprays into both nostrils daily. 07/07/21   Rhys Martini, PA-C  ?ibuprofen (ADVIL) 600 MG tablet Take 1 tablet (600 mg total) by mouth every 8 (eight) hours as needed. 11/18/21   Freddy Finner, NP  ?ipratropium (ATROVENT) 0.03 % nasal spray Place 2 sprays into both nostrils every 12 (twelve) hours. 12/31/21   Margaretann Loveless, PA-C  ?ondansetron (ZOFRAN) 4 MG tablet Take 1 tablet (4 mg total) by mouth every 8 (eight) hours as needed for nausea or vomiting. 12/31/21   Margaretann Loveless, PA-C  ? ? ?Family History ?Family History  ?Problem Relation Age of Onset  ? Hypertension Mother   ? Mental illness Mother   ? Hyperlipidemia Father   ? Birth defects Maternal Grandmother   ? Heart attack Neg Hx   ? Diabetes Neg Hx   ? Sudden death Neg Hx   ? ? ?Social History ?Social History  ? ?Tobacco Use  ? Smoking status: Some Days  ?  Types: Cigarettes  ? Smokeless tobacco: Never  ? Tobacco comments:  ?  2 cigarettes a day  ?Vaping Use  ? Vaping Use: Every day  ?  Substances: Nicotine, Flavoring  ?Substance Use Topics  ? Alcohol use: No  ? Drug use: Not Currently  ?  Types: Cocaine, Marijuana  ? ? ? ?Allergies   ?Patient has no known allergies. ? ? ?Review of Systems ?Review of Systems  ?Constitutional:  Negative for activity change, appetite change, fatigue and fever.  ?Gastrointestinal:  Negative for abdominal pain, diarrhea, nausea and vomiting.  ?Genitourinary:  Positive for vaginal discharge. Negative for dysuria, flank pain, frequency, pelvic pain, vaginal bleeding and vaginal pain.  ? ? ?Physical Exam ?Triage Vital Signs ?ED Triage Vitals [03/07/22 1649]  ?Enc Vitals Group  ?   BP 126/87  ?   Pulse  Rate 86  ?   Resp 18  ?   Temp 98.3 ?F (36.8 ?C)  ?   Temp Source Oral  ?   SpO2 100 %  ?   Weight   ?   Height   ?   Head Circumference   ?   Peak Flow   ?   Pain Score   ?   Pain Loc   ?   Pain Edu?   ?   Excl. in GC?   ? ?No data found. ? ?Updated Vital Signs ?BP 126/87 (BP Location: Left Arm)   Pulse 86   Temp 98.3 ?F (36.8 ?C) (Oral)   Resp 18   SpO2 100%  ? ?Visual Acuity ?Right Eye Distance:   ?Left Eye Distance:   ?Bilateral Distance:   ? ?Right Eye Near:   ?Left Eye Near:    ?Bilateral Near:    ? ?Physical Exam ?Vitals reviewed.  ?Constitutional:   ?   General: She is awake. She is not in acute distress. ?   Appearance: Normal appearance. She is well-developed. She is not ill-appearing.  ?   Comments: Very pleasant female appears stated age in no acute distress sitting comfortably in exam room  ?HENT:  ?   Head: Normocephalic and atraumatic.  ?Cardiovascular:  ?   Rate and Rhythm: Normal rate and regular rhythm.  ?   Heart sounds: Normal heart sounds, S1 normal and S2 normal. No murmur heard. ?Pulmonary:  ?   Effort: Pulmonary effort is normal.  ?   Breath sounds: Normal breath sounds. No wheezing, rhonchi or rales.  ?   Comments: Clear to auscultation bilaterally ?Abdominal:  ?   General: Bowel sounds are normal.  ?   Palpations: Abdomen is soft.  ?   Tenderness: There is abdominal tenderness in the epigastric area. There is no right CVA tenderness, left CVA tenderness, guarding or rebound.  ?   Comments: Mild tenderness palpation in upper abdomen.  No evidence of acute abdomen on physical exam.  ?Genitourinary: ?   Comments: Exam deferred ?Psychiatric:     ?   Behavior: Behavior is cooperative.  ? ? ? ?UC Treatments / Results  ?Labs ?(all labs ordered are listed, but only abnormal results are displayed) ?Labs Reviewed  ?POCT URINALYSIS DIPSTICK, ED / UC - Abnormal; Notable for the following components:  ?    Result Value  ? Hgb urine dipstick MODERATE (*)   ? All other components within normal limits   ?HIV ANTIBODY (ROUTINE TESTING W REFLEX)  ?HEPATITIS C ANTIBODY  ?RPR  ?POC URINE PREG, ED  ?CERVICOVAGINAL ANCILLARY ONLY  ? ? ?EKG ? ? ?Radiology ?No results found. ? ?Procedures ?Procedures (including critical care time) ? ?Medications Ordered in UC ?Medications - No data to display ? ?Initial Impression / Assessment  and Plan / UC Course  ?I have reviewed the triage vital signs and the nursing notes. ? ?Pertinent labs & imaging results that were available during my care of the patient were reviewed by me and considered in my medical decision making (see chart for details). ? ?  ? ?Patient is well-appearing, afebrile, nontoxic, nontachycardic.  Urine pregnancy was negative.  UA showed hemoglobin but otherwise was normal.  STI swab collected today.  HIV/hepatitis/syphilis testing obtained today.  Discussed with and utility of empiric treatment for bacterial vaginosis, however, patient reports current symptoms are not similar to previous episodes of this so we will defer treatment until results are available.  Discussed that if her testing is negative and she continues to have symptoms she should follow-up with OB/GYN was given contact information for local provider.  Recommended loosefitting cotton underwear and hypoallergenic soaps and detergents.  Discussed that if she develops any symptoms including pelvic pain, abdominal pain, fever, nausea, vomiting she should be seen immediately.  Strict return precautions given to which she expressed understanding. ? ?Final Clinical Impressions(s) / UC Diagnoses  ? ?Final diagnoses:  ?Vaginal discharge  ? ? ? ?Discharge Instructions   ? ?  ?Your urine was normal with only some blood but no evidence of infection.  We will contact you with your results if we need to arrange any treatment.  Wear loosefitting cotton underwear and use hypoallergenic soaps and detergents.  If you develop any pelvic pain, severe abdominal pain, fever, nausea, vomiting you should be seen  immediately.  If symptoms persist but your testing is negative I recommended follow-up with an OB/GYN; call to schedule an appointment. ? ? ? ? ?ED Prescriptions   ?None ?  ? ?PDMP not reviewed this encounter. ?  ?Katieann Hungate,

## 2022-03-08 LAB — RPR: RPR Ser Ql: NONREACTIVE

## 2022-03-09 LAB — CERVICOVAGINAL ANCILLARY ONLY
Bacterial Vaginitis (gardnerella): NEGATIVE
Candida Glabrata: NEGATIVE
Candida Vaginitis: NEGATIVE
Chlamydia: NEGATIVE
Comment: NEGATIVE
Comment: NEGATIVE
Comment: NEGATIVE
Comment: NEGATIVE
Comment: NEGATIVE
Comment: NORMAL
Neisseria Gonorrhea: NEGATIVE
Trichomonas: NEGATIVE

## 2022-03-09 LAB — POCT URINALYSIS DIPSTICK, ED / UC
Bilirubin Urine: NEGATIVE
Glucose, UA: NEGATIVE mg/dL
Ketones, ur: NEGATIVE mg/dL
Leukocytes,Ua: NEGATIVE
Nitrite: NEGATIVE
Protein, ur: NEGATIVE mg/dL
Specific Gravity, Urine: 1.01 (ref 1.005–1.030)
Urobilinogen, UA: 0.2 mg/dL (ref 0.0–1.0)
pH: 7 (ref 5.0–8.0)

## 2022-03-14 ENCOUNTER — Ambulatory Visit (HOSPITAL_COMMUNITY)
Admission: RE | Admit: 2022-03-14 | Discharge: 2022-03-14 | Disposition: A | Discharge status: Home / Self Care | Payer: Self-pay | Source: Ambulatory Visit | Attending: Neurosurgery | Admitting: Neurosurgery

## 2022-03-14 DIAGNOSIS — G935 Compression of brain: Secondary | ICD-10-CM | POA: Insufficient documentation

## 2022-05-07 ENCOUNTER — Ambulatory Visit: Payer: Self-pay | Admitting: Family Medicine

## 2022-06-22 ENCOUNTER — Emergency Department (HOSPITAL_COMMUNITY): Payer: Self-pay

## 2022-06-22 ENCOUNTER — Other Ambulatory Visit: Payer: Self-pay

## 2022-06-22 ENCOUNTER — Encounter (HOSPITAL_BASED_OUTPATIENT_CLINIC_OR_DEPARTMENT_OTHER): Payer: Self-pay | Admitting: Emergency Medicine

## 2022-06-22 ENCOUNTER — Emergency Department (HOSPITAL_BASED_OUTPATIENT_CLINIC_OR_DEPARTMENT_OTHER)
Admission: EM | Admit: 2022-06-22 | Discharge: 2022-06-22 | Discharge status: Against Medical Advice | Payer: Self-pay | Attending: Emergency Medicine | Admitting: Emergency Medicine

## 2022-06-22 DIAGNOSIS — R519 Headache, unspecified: Secondary | ICD-10-CM

## 2022-06-22 DIAGNOSIS — Z20822 Contact with and (suspected) exposure to covid-19: Secondary | ICD-10-CM | POA: Insufficient documentation

## 2022-06-22 DIAGNOSIS — M5481 Occipital neuralgia: Secondary | ICD-10-CM | POA: Insufficient documentation

## 2022-06-22 DIAGNOSIS — M549 Dorsalgia, unspecified: Secondary | ICD-10-CM | POA: Insufficient documentation

## 2022-06-22 DIAGNOSIS — R202 Paresthesia of skin: Secondary | ICD-10-CM | POA: Insufficient documentation

## 2022-06-22 DIAGNOSIS — R42 Dizziness and giddiness: Secondary | ICD-10-CM | POA: Insufficient documentation

## 2022-06-22 DIAGNOSIS — R55 Syncope and collapse: Secondary | ICD-10-CM | POA: Insufficient documentation

## 2022-06-22 DIAGNOSIS — R200 Anesthesia of skin: Secondary | ICD-10-CM

## 2022-06-22 DIAGNOSIS — M542 Cervicalgia: Secondary | ICD-10-CM | POA: Insufficient documentation

## 2022-06-22 LAB — RESP PANEL BY RT-PCR (FLU A&B, COVID) ARPGX2
Influenza A by PCR: NEGATIVE
Influenza B by PCR: NEGATIVE
SARS Coronavirus 2 by RT PCR: NEGATIVE

## 2022-06-22 LAB — PREGNANCY, URINE: Preg Test, Ur: NEGATIVE

## 2022-06-22 MED ORDER — PROCHLORPERAZINE EDISYLATE 10 MG/2ML IJ SOLN
10.0000 mg | Freq: Once | INTRAMUSCULAR | Status: AC
  Administered 2022-06-22: 10 mg via INTRAVENOUS
  Filled 2022-06-22: qty 2

## 2022-06-22 MED ORDER — DIPHENHYDRAMINE HCL 50 MG/ML IJ SOLN
50.0000 mg | Freq: Once | INTRAMUSCULAR | Status: AC
  Administered 2022-06-22: 50 mg via INTRAVENOUS
  Filled 2022-06-22: qty 1

## 2022-06-22 MED ORDER — SODIUM CHLORIDE 0.9 % IV BOLUS
1000.0000 mL | Freq: Once | INTRAVENOUS | Status: AC
  Administered 2022-06-22: 1000 mL via INTRAVENOUS

## 2022-06-22 NOTE — ED Notes (Signed)
Pt called 4x no answer  

## 2022-06-22 NOTE — ED Triage Notes (Signed)
Patient presents to ED via POV from home. Here with headache and body aches that began yesterday.

## 2022-06-22 NOTE — ED Provider Notes (Signed)
[facility] EMERGENCY DEPARTMENT Provider Note   CSN: [id number] Arrival date & time: 06/22/22  1142     History  Chief Complaint  Patient presents with   Headache    Desiree Berger is a 26 y.o. female.  The history is provided by the patient and medical records. No language interpreter was used.  Headache Pain location:  Occipital Quality:  Dull Radiates to:  L neck Severity currently:  5/10 Severity at highest:  6/10 Onset quality:  Gradual Duration:  2 days Timing:  Constant Progression:  Waxing and waning Chronicity:  Recurrent Similar to prior headaches: yes   Relieved by:  Nothing Worsened by:  Nothing Ineffective treatments:  None tried Associated symptoms: back pain, dizziness, near-syncope (lightheaded with it), neck pain, numbness, paresthesias and visual change (resolved)   Associated symptoms: no abdominal pain, no blurred vision (flashes present), no congestion, no cough, no diarrhea, no drainage, no ear pain, no eye pain, no facial pain, no fatigue, no fever, no focal weakness, no myalgias, no nausea, no neck stiffness, no photophobia, no seizures, no sinus pressure, no vomiting and no weakness   Visual Change:    Location:  Both eyes   Quality comment:  Flashes      Home Medications Prior to Admission medications   Medication Sig Start Date End Date Taking? Authorizing Provider  albuterol (VENTOLIN HFA) 108 (90 Base) MCG/ACT inhaler Inhale 2 puffs into the lungs every 6 (six) hours as needed for wheezing or shortness of breath. 08/20/20   Couture, Cortni S, PA-C  brompheniramine-pseudoephedrine-DM 30-2-10 MG/5ML syrup Take 5 mLs by mouth 4 (four) times daily as needed. 12/31/21   Margaretann Loveless, PA-C  etonogestrel (NEXPLANON) 68 MG IMPL implant 1 each once by Subdermal route.    [provider]  fluticasone (FLONASE) 50 MCG/ACT nasal spray Place 2 sprays into both nostrils daily. 07/07/21   Rhys Martini, PA-C  ibuprofen  (ADVIL) 600 MG tablet Take 1 tablet (600 mg total) by mouth every 8 (eight) hours as needed. 11/18/21   Freddy Finner, NP  ipratropium (ATROVENT) 0.03 % nasal spray Place 2 sprays into both nostrils every 12 (twelve) hours. 12/31/21   Margaretann Loveless, PA-C  ondansetron (ZOFRAN) 4 MG tablet Take 1 tablet (4 mg total) by mouth every 8 (eight) hours as needed for nausea or vomiting. 12/31/21   Margaretann Loveless, PA-C      Allergies    Patient has no known allergies.    Review of Systems   Review of Systems  Constitutional:  Negative for chills, diaphoresis, fatigue and fever.  HENT:  Negative for congestion, ear pain, postnasal drip and sinus pressure.   Eyes:  Positive for visual disturbance. Negative for blurred vision (flashes present), photophobia and pain.  Respiratory:  Negative for cough, chest tightness, shortness of breath, wheezing and stridor.   Cardiovascular:  Positive for near-syncope (lightheaded with it). Negative for chest pain, palpitations and leg swelling.  Gastrointestinal:  Negative for abdominal pain, diarrhea, nausea and vomiting.  Genitourinary:  Negative for dysuria and flank pain.  Musculoskeletal:  Positive for back pain and neck pain. Negative for myalgias and neck stiffness.  Skin:  Negative for rash and wound.  Neurological:  Positive for dizziness, light-headedness, numbness, headaches and paresthesias. Negative for focal weakness, seizures, syncope, facial asymmetry, speech difficulty and weakness.  Psychiatric/Behavioral:  Negative for agitation and confusion.   All other systems reviewed and are negative.   Physical Exam Updated Vital  Signs BP 125/83 (BP Location: Left Arm)   Pulse (!) 54   Temp 98.4 F (36.9 C) (Oral)   Resp 18   SpO2 99%  Physical Exam Vitals and nursing note reviewed.  Constitutional:      General: She is not in acute distress.    Appearance: She is well-developed. She is not ill-appearing, toxic-appearing or diaphoretic.   HENT:     Head: Normocephalic and atraumatic.     Nose: Nose normal.     Mouth/Throat:     Mouth: Mucous membranes are moist.  Eyes:     Extraocular Movements: Extraocular movements intact.     Conjunctiva/sclera: Conjunctivae normal.     Pupils: Pupils are equal, round, and reactive to light.  Cardiovascular:     Rate and Rhythm: Normal rate and regular rhythm.     Heart sounds: No murmur heard. Pulmonary:     Effort: Pulmonary effort is normal. No respiratory distress.     Breath sounds: Normal breath sounds. No wheezing, rhonchi or rales.  Chest:     Chest wall: No tenderness.  Abdominal:     Palpations: Abdomen is soft.     Tenderness: There is no abdominal tenderness. There is no guarding or rebound.  Musculoskeletal:        General: Tenderness present. No swelling.     Cervical back: Neck supple.  Skin:    General: Skin is warm and dry.     Capillary Refill: Capillary refill takes less than 2 seconds.     Findings: No erythema or rash.  Neurological:     Mental Status: She is alert.     Cranial Nerves: No dysarthria or facial asymmetry.     Sensory: Sensory deficit present.     Motor: No weakness, abnormal muscle tone or seizure activity.     Coordination: Coordination normal. Finger-Nose-Finger Test normal.     Comments: Intact sensation in both legs.-Strength in legs.  Intact reflexes in legs.  Arms had decree sensation left arm compared to right but symmetric grip strength.  Tach pulses.  Hoffmann sign negative both hands.  Normal finger-nose-finger testing bilaterally.  Symmetric smile.  Clear speech.  Pupils symmetric and reactive, extract movements.  No carotid bruit appreciated.  Psychiatric:        Mood and Affect: Mood normal.     ED Results / Procedures / Treatments   Labs (all labs ordered are listed, but only abnormal results are displayed) Labs Reviewed  RESP PANEL BY RT-PCR (FLU A&B, COVID) ARPGX2  PREGNANCY, URINE    EKG None  Radiology No  results found.  Procedures Procedures    Medications Ordered in ED Medications  prochlorperazine (COMPAZINE) injection 10 mg (10 mg Intravenous Given 06/22/22 1642)  diphenhydrAMINE (BENADRYL) injection 50 mg (50 mg Intravenous Given 06/22/22 1642)  sodium chloride 0.9 % bolus 1,000 mL (1,000 mLs Intravenous New Bag/Given 06/22/22 1643)    ED Course/ Medical Decision Making/ A&P                           Medical Decision Making Amount and/or Complexity of Data Reviewed Radiology: ordered.  Risk Prescription drug management.     Desiree Berger is a 26 y.o. 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  Patient says that she was diagnosed several months ago with a suspected  Chiari malformation and reports that she has an MRI coming up to further evaluate.  She reports that her headaches have been minimal but yesterday they started worsening.  She reports that she felt a feeling with severe onset of pain in her occiput and back of the head that went towards her neck that felt like a " drop zone" carnival ride with her body and a free full sensation.  She also reports she had tingling in both arms and both legs and had some vision changes with flashes in her vision in both eyes.  She reports it lasted for a while but then started to improve however today she reports she is still having moderate to severe headache in her occiput and upper neck.  She reports she still had the intermittent symptoms in arms and legs today but denies nausea, vomiting, fevers, or chills.  Denies any head trauma or neck trauma.  Denies any rashes.  Denies any constipation, diarrhea, or urinary changes.  She reports she had some low back soreness that has resolved.  No vaginal or urinary complaints.  On exam, lungs clear and chest  nontender.  Abdomen nontender.  Patient does have tenderness in her neck and occipital area.  On my exam she does have subjective numbness in her left arm compared to right which she says is new and is unsure when this began.  Hoffmann sign negative on both hand exam.  She has intact reflexes in all extremities.  Symmetric smile.  Clear speech.  Pupils symmetric and reactive normal extraocular movements.  No carotid bruit appreciated.  Given her new numbness in the arm, the transient tingling sensation in all extremities, and the concern for Chiari affirmation, I am concerned about the transient worsening headache with some of the vision changes, lightheaded, and dizzy feelings.  We will give her headache cocktail as this helped her in the past and she wanted some medicine to help with her symptoms however we also discussed with neurosurgery if patient needs MRI.  Given these new and more persistent symptoms, anticipate she may need MRI tonight to further evaluate and neurosurgical evaluation.  We will consult neurosurgery for recommendations.  Headache cocktail was ordered and given.  She reports headache is improving.  Spoke to Dr. Conchita Paris with neurosurgery who reviewed the case.  He has less suspicion that this is related to her Chiari malformation but due to the arm symptoms, he does feel it is reasonable to get MRI without contrast of her C-spine to rule out some other etiology at this time.  Family is in agreement with this.  We will transfer ED to ED and excepted by Dr. Anitra Lauth for MRI of the C-spine.  Plan of care will be to consult neurosurgery if there is concerning findings on the MRI however if MRI does not show some acute abnormality, plan of care is to continue follow-up with outpatient neurosurgery team.  5:38 PM On my reassessment after headache cocktail, headache is improved and the numbness has also proved in the left arm.  This is reassuring.  Patient still wants to get the MRI  so we will do the ED transfer.  Given her improved symptoms, I do feel she is appropriate for ED to ED transfer by personal vehicle but family understands to call for help if symptoms were to change or worsen.  Patient be transferred for MRI and anticipate discharge home if it is reassuring with outpatient follow-up.         Final  Clinical Impression(s) / ED Diagnoses Final diagnoses:  Occipital headache  Neck pain  Tingling in extremities  Left arm numbness    Clinical Impression: 1. Occipital headache   2. Neck pain   3. Tingling in extremities   4. Left arm numbness     Disposition: ED to ED transfer accepted by Dr. Anitra Lauth for MRI of the C-spine.  If it is reassuring, dissipate discharge home.  If concerning findings are discovered, anticipate neurosurgical consultation and I spoke to Dr. Conchita Paris earlier who has been involved in her care previously.  This note was prepared with assistance of Conservation officer, historic buildings. Occasional wrong-word or sound-a-like substitutions may have occurred due to the inherent limitations of voice recognition software.      Dajion Bickford, Canary Brim, MD 06/22/22 1739

## 2022-06-22 NOTE — ED Notes (Signed)
Transfer instructions reviewed with patient. IV removed at request of patient. Pt voices understanding of transfer process.

## 2022-07-16 ENCOUNTER — Ambulatory Visit (INDEPENDENT_AMBULATORY_CARE_PROVIDER_SITE_OTHER): Payer: Self-pay | Admitting: Family Medicine

## 2022-07-16 ENCOUNTER — Encounter: Payer: Self-pay | Admitting: Family Medicine

## 2022-07-16 VITALS — BP 118/80 | HR 90 | Temp 98.0°F | Ht 64.5 in | Wt 205.2 lb

## 2022-07-16 DIAGNOSIS — Z599 Problem related to housing and economic circumstances, unspecified: Secondary | ICD-10-CM

## 2022-07-16 DIAGNOSIS — F411 Generalized anxiety disorder: Secondary | ICD-10-CM

## 2022-07-16 DIAGNOSIS — R45851 Suicidal ideations: Secondary | ICD-10-CM | POA: Insufficient documentation

## 2022-07-16 DIAGNOSIS — Z91410 Personal history of adult physical and sexual abuse: Secondary | ICD-10-CM

## 2022-07-16 DIAGNOSIS — J302 Other seasonal allergic rhinitis: Secondary | ICD-10-CM

## 2022-07-16 DIAGNOSIS — F1994 Other psychoactive substance use, unspecified with psychoactive substance-induced mood disorder: Secondary | ICD-10-CM

## 2022-07-16 DIAGNOSIS — F250 Schizoaffective disorder, bipolar type: Secondary | ICD-10-CM

## 2022-07-16 DIAGNOSIS — F322 Major depressive disorder, single episode, severe without psychotic features: Secondary | ICD-10-CM
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
## 2022-07-16 NOTE — Assessment & Plan Note (Signed)
Limiting access some care, although improving and may have stable reassurance over the next month  Discussed referral to social work, however patient declined
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
## 2022-07-16 NOTE — Assessment & Plan Note (Signed)
With other comorbid psychiatric illness including passive SI, psychosis, bipolar Possibly worsened by intermittent marijuana use Referral to psychiatry placed Urgent psychiatric resources discussed with patient and provided in handout

## 2022-07-16 NOTE — Assessment & Plan Note (Signed)
Recommend OTC Flonase and Astelin as needed

## 2022-10-15 ENCOUNTER — Ambulatory Visit (INDEPENDENT_AMBULATORY_CARE_PROVIDER_SITE_OTHER): Payer: Self-pay | Admitting: Family Medicine

## 2022-10-15 ENCOUNTER — Encounter: Payer: Self-pay | Admitting: Family Medicine

## 2022-10-15 VITALS — BP 122/82 | HR 90 | Temp 97.0°F | Wt 203.0 lb

## 2022-10-15 DIAGNOSIS — R350 Frequency of micturition: Secondary | ICD-10-CM | POA: Insufficient documentation

## 2022-10-15 DIAGNOSIS — G935 Compression of brain: Secondary | ICD-10-CM

## 2022-10-15 DIAGNOSIS — F322 Major depressive disorder, single episode, severe without psychotic features: Secondary | ICD-10-CM

## 2022-10-15 DIAGNOSIS — N3941 Urge incontinence: Secondary | ICD-10-CM | POA: Diagnosis not present

## 2022-10-15 DIAGNOSIS — Z124 Encounter for screening for malignant neoplasm of cervix: Secondary | ICD-10-CM

## 2022-10-15 DIAGNOSIS — Z1329 Encounter for screening for other suspected endocrine disorder: Secondary | ICD-10-CM

## 2022-10-15 DIAGNOSIS — Z0000 Encounter for general adult medical examination without abnormal findings: Secondary | ICD-10-CM

## 2022-10-15 DIAGNOSIS — Z13220 Encounter for screening for lipoid disorders: Secondary | ICD-10-CM

## 2022-10-15 LAB — BASIC METABOLIC PANEL
BUN: 16 mg/dL (ref 6–23)
CO2: 26 mEq/L (ref 19–32)
Calcium: 9.1 mg/dL (ref 8.4–10.5)
Chloride: 107 mEq/L (ref 96–112)
Creatinine, Ser: 0.81 mg/dL (ref 0.40–1.20)
GFR: 101.79 mL/min (ref >60.00)
Glucose, Bld: 93 mg/dL (ref 70–99)
Potassium: 4.1 mEq/L (ref 3.5–5.1)
Sodium: 140 mEq/L (ref 135–145)

## 2022-10-15 LAB — POCT URINALYSIS DIPSTICK
Bilirubin, UA: POSITIVE
Blood, UA: POSITIVE
Glucose, UA: NEGATIVE
Ketones, UA: NEGATIVE
Nitrite, UA: NEGATIVE
Protein, UA: NEGATIVE
Spec Grav, UA: >=1.03 — AB (ref 1.010–1.025)
Urobilinogen, UA: 0.2 E.U./dL
pH, UA: 5.5 (ref 5.0–8.0)

## 2022-10-15 LAB — LIPID PANEL
Cholesterol: 173 mg/dL (ref 0–200)
HDL: 40 mg/dL (ref >39.00)
LDL Cholesterol: 118 mg/dL — ABNORMAL HIGH (ref 0–99)
NonHDL: 133.4
Total CHOL/HDL Ratio: 4
Triglycerides: 78 mg/dL (ref 0.0–149.0)
VLDL: 15.6 mg/dL (ref 0.0–40.0)

## 2022-10-15 MED ORDER — NITROFURANTOIN MONOHYD MACRO 100 MG PO CAPS: 100.0000 mg | ORAL_CAPSULE | Freq: Two times a day (BID) | ORAL | 0 refills | Status: AC

## 2022-10-15 NOTE — Assessment & Plan Note (Signed)
Significant improvement in mood with improvement left circumstances No SI or HI Unable to follow-up with psychiatry Declined referral However given patient's treatment, we can continue monitoring clinic

## 2022-10-15 NOTE — Assessment & Plan Note (Signed)
Possible UTI.  Treating with Macrobid.  Urine culture pending
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
## 2022-10-15 NOTE — Assessment & Plan Note (Signed)
Improvement headaches.  No neurologic deficits on exam.  Has followed with [facility], Dr. Conchita Paris.  However not currently following.  Referral offered, patient declined. If worsening headaches or other neurologic abnormalities, low threshold to refer to neurosurgery

## 2022-10-16 LAB — URINE CULTURE
MICRO NUMBER:: 14315899
SPECIMEN QUALITY:: ADEQUATE

## 2022-10-16 LAB — EXTRA SPECIMEN

## 2022-10-16 LAB — EXTRA LAV TOP TUBE

## 2023-02-15 ENCOUNTER — Encounter: Payer: Self-pay | Admitting: *Deleted

## 2023-02-16 ENCOUNTER — Ambulatory Visit: Payer: Self-pay | Admitting: Family Medicine

## 2023-02-23 ENCOUNTER — Ambulatory Visit (HOSPITAL_COMMUNITY)
Admission: EM | Admit: 2023-02-23 | Discharge: 2023-02-24 | Disposition: A | Discharge status: Home / Self Care | Payer: No Typology Code available for payment source | Attending: Family | Admitting: Family

## 2023-02-23 ENCOUNTER — Other Ambulatory Visit: Payer: Self-pay

## 2023-02-23 DIAGNOSIS — F250 Schizoaffective disorder, bipolar type: Secondary | ICD-10-CM | POA: Insufficient documentation

## 2023-02-23 DIAGNOSIS — F411 Generalized anxiety disorder: Secondary | ICD-10-CM | POA: Diagnosis not present

## 2023-02-23 DIAGNOSIS — R45851 Suicidal ideations: Secondary | ICD-10-CM | POA: Diagnosis not present

## 2023-02-23 DIAGNOSIS — Z9151 Personal history of suicidal behavior: Secondary | ICD-10-CM | POA: Diagnosis not present

## 2023-02-23 DIAGNOSIS — I498 Other specified cardiac arrhythmias: Secondary | ICD-10-CM | POA: Insufficient documentation

## 2023-02-23 DIAGNOSIS — Z1152 Encounter for screening for COVID-19: Secondary | ICD-10-CM | POA: Insufficient documentation

## 2023-02-23 DIAGNOSIS — Z79899 Other long term (current) drug therapy: Secondary | ICD-10-CM | POA: Insufficient documentation

## 2023-02-23 DIAGNOSIS — Z818 Family history of other mental and behavioral disorders: Secondary | ICD-10-CM | POA: Diagnosis not present

## 2023-02-23 DIAGNOSIS — R441 Visual hallucinations: Secondary | ICD-10-CM | POA: Diagnosis present

## 2023-02-23 DIAGNOSIS — F1290 Cannabis use, unspecified, uncomplicated: Secondary | ICD-10-CM | POA: Insufficient documentation

## 2023-02-23 LAB — COMPREHENSIVE METABOLIC PANEL
ALT: 15 U/L (ref 0–44)
AST: 16 U/L (ref 15–41)
Albumin: 3.9 g/dL (ref 3.5–5.0)
Alkaline Phosphatase: 36 U/L — ABNORMAL LOW (ref 38–126)
Anion gap: 9 (ref 5–15)
BUN: 10 mg/dL (ref 6–20)
CO2: 22 mmol/L (ref 22–32)
Calcium: 8.9 mg/dL (ref 8.9–10.3)
Chloride: 108 mmol/L (ref 98–111)
Creatinine, Ser: 0.76 mg/dL (ref 0.44–1.00)
GFR, Estimated: >60 mL/min (ref >60)
Glucose, Bld: 100 mg/dL — ABNORMAL HIGH (ref 70–99)
Potassium: 3.9 mmol/L (ref 3.5–5.1)
Sodium: 139 mmol/L (ref 135–145)
Total Bilirubin: 0.5 mg/dL (ref 0.3–1.2)
Total Protein: 6.4 g/dL — ABNORMAL LOW (ref 6.5–8.1)

## 2023-02-23 LAB — POCT URINE DRUG SCREEN - MANUAL ENTRY (I-SCREEN)
POC Amphetamine UR: NOT DETECTED
POC Buprenorphine (BUP): NOT DETECTED
POC Cocaine UR: NOT DETECTED
POC Marijuana UR: POSITIVE — AB
POC Methadone UR: NOT DETECTED
POC Methamphetamine UR: NOT DETECTED
POC Morphine: NOT DETECTED
POC Oxazepam (BZO): NOT DETECTED
POC Oxycodone UR: NOT DETECTED
POC Secobarbital (BAR): NOT DETECTED

## 2023-02-23 LAB — CBC WITH DIFFERENTIAL/PLATELET
Abs Immature Granulocytes: 0.02 10*3/uL (ref 0.00–0.07)
Basophils Absolute: 0.1 10*3/uL (ref 0.0–0.1)
Basophils Relative: 1 %
Eosinophils Absolute: 0.2 10*3/uL (ref 0.0–0.5)
Eosinophils Relative: 2 %
HCT: 41.5 % (ref 36.0–46.0)
Hemoglobin: 13.8 g/dL (ref 12.0–15.0)
Immature Granulocytes: 0 %
Lymphocytes Relative: 39 %
Lymphs Abs: 3.8 10*3/uL (ref 0.7–4.0)
MCH: 30.7 pg (ref 26.0–34.0)
MCHC: 33.3 g/dL (ref 30.0–36.0)
MCV: 92.2 fL (ref 80.0–100.0)
Monocytes Absolute: 0.6 10*3/uL (ref 0.1–1.0)
Monocytes Relative: 7 %
Neutro Abs: 5 10*3/uL (ref 1.7–7.7)
Neutrophils Relative %: 51 %
Platelets: 268 10*3/uL (ref 150–400)
RBC: 4.5 MIL/uL (ref 3.87–5.11)
RDW: 12.1 % (ref 11.5–15.5)
WBC: 9.6 10*3/uL (ref 4.0–10.5)
nRBC: 0 % (ref 0.0–0.2)

## 2023-02-23 LAB — MAGNESIUM: Magnesium: 2.1 mg/dL (ref 1.7–2.4)

## 2023-02-23 LAB — TSH: TSH: 1.2 u[IU]/mL (ref 0.350–4.500)

## 2023-02-23 LAB — POC URINE PREG, ED: Preg Test, Ur: NEGATIVE

## 2023-02-23 LAB — ETHANOL: Alcohol, Ethyl (B): <10 mg/dL (ref <10)

## 2023-02-23 LAB — SARS CORONAVIRUS 2 BY RT PCR: SARS Coronavirus 2 by RT PCR: NEGATIVE

## 2023-02-23 LAB — POC SARS CORONAVIRUS 2 AG: SARSCOV2ONAVIRUS 2 AG: NEGATIVE

## 2023-02-23 MED ORDER — MAGNESIUM HYDROXIDE 400 MG/5ML PO SUSP: 30.0000 mL | Freq: Every day | ORAL | Status: DC | PRN

## 2023-02-23 MED ORDER — TRAZODONE HCL 50 MG PO TABS: 50.0000 mg | ORAL_TABLET | Freq: Every evening | ORAL | Status: DC | PRN

## 2023-02-23 MED ORDER — ACETAMINOPHEN 325 MG PO TABS: 650.0000 mg | ORAL_TABLET | Freq: Four times a day (QID) | ORAL | Status: DC | PRN

## 2023-02-23 MED ORDER — RISPERIDONE 1 MG PO TBDP
1.0000 mg | ORAL_TABLET | Freq: Two times a day (BID) | ORAL | Status: DC
  Administered 2023-02-23 – 2023-02-24 (×2): 1 mg via ORAL
  Filled 2023-02-23 (×3): qty 1

## 2023-02-23 MED ORDER — HYDROXYZINE HCL 25 MG PO TABS: 25.0000 mg | ORAL_TABLET | Freq: Three times a day (TID) | ORAL | Status: DC | PRN

## 2023-02-23 MED ORDER — ALUM & MAG HYDROXIDE-SIMETH 200-200-20 MG/5ML PO SUSP: 30.0000 mL | ORAL | Status: DC | PRN
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
## 2023-02-23 NOTE — ED Notes (Signed)
While speaking with patient she stated that she no longer wanting to be here and would like to leave. This nurse advised the patient that the providers come in at 8 pm and I will make them aware, as they are the ones who are able to put in the discharge orders.

## 2023-02-23 NOTE — ED Notes (Signed)
Pt cal and cooperative. No c/o of pain or distress. Alert and orient x 4. Will continue to monitor for safety

## 2023-02-23 NOTE — ED Notes (Signed)
Pt cooperative with assessment. Blood work complete, UDS done, rapid covid negative, skin assessment WNL. Endorses passive SI, states she has visual hallucinations. Contracts for safety on unit.
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
## 2023-02-23 NOTE — ED Notes (Signed)
Pt sleeping@this time. Breathing even and unlabored. Will continue to monitor for safety 

## 2023-02-23 NOTE — ED Provider Notes (Signed)
[facility] Urgent Care Continuous Assessment Admission H&P  Date: 02/23/23 Patient Name: Desiree Berger MRN: [id number] Chief Complaint: Visual hallucinations  Diagnoses:  Final diagnoses:  Schizoaffective disorder, bipolar type    HPI: Patient presents voluntarily to go for [facility] behavioral health walk-in assessment.  Patient is accompanied by her brother, Bayyinah Dukeman, prefers that he remain present during assessment. Patient is assessed by this nurse practitioner face-to-face.  She is seated in assessment area upon my approach, no apparent distress.  She is alert and oriented, pleasant and cooperative during assessment.  She presents with euthymic mood, congruent affect.  Sharlize, prefers [employer]chant [employer] officer."  Patient states "I have been seeing spots in my peripheral vision, my [facility] therapist told me if that happens I should come here."  Patient reports visual hallucinations that began today.  Patient states "I clicked my mouse and I saw cascading lights that were red and yellow and a dancing Mickey Mouse on the screen.  I also saw a blue moon in the corner but I know it was not real."  No reported auditory or command hallucinations.  She endorses passive suicidal and homicidal ideations, ongoing x 1 year.  She denies any plan or intent to harm herself or others.  She endorses history of approximately 5 previous suicide attempts.  Reports most recent suicide attempt 3 years ago when she "snorted pain medications."  She denies history of nonsuicidal self-harm behavior.  Denies history if violence toward others.   [name] is not linked with outpatient psychiatry currently.  Discontinued appointments with [facility] for individual counseling 6 months ago related to "once I pay my fee I can go back."  She has not met with outpatient psychiatry in approximately 2 years.  She has not been prescribed medications for more than 2 years.  She endorses history of 4 previous inpatient psychiatric  hospitalizations.  Most recent psychiatric hospitalization 3 years ago at [facility] behavioral health.  Her diagnoses include generalized anxiety disorder, major depressive disorder, substance-induced mood disorder, schizoaffective disorder, bipolar type, suicidal ideation.  Family mental health history includes patient's mother and maternal grandmother, both diagnosed with anxiety.  Patient's brother diagnosed with ADHD.  Patient resides in [place] with her brother and father.  She denies access to weapons.  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  She endorses average appetite and decreased sleep.  She endorses daily marijuana use, most recent use of marijuana on last night.  She endorses rare alcohol use, typically consumes 1 drink less than 1 time per month.  She states "I do not think about alcohol much, I focus on marijuana."  She denies substance use aside from marijuana and alcohol.  Patient offered support and encouragement. [name] agrees with treatment plan to include inpatient psychiatric treatment.  Reviewed medications including risperidone, discussed potential side effects and offered opportunity to ask questions.  Patient confirms desire to remain full CODE STATUS.  Total Time spent with patient: 30 minutes  Musculoskeletal  Strength & Muscle Tone: within normal limits Gait & Station: normal Patient leans: N/A  Psychiatric Specialty Exam  Presentation General Appearance:  Appropriate for Environment; Casual  Eye Contact: Good  Speech: Clear and Coherent; Normal Rate  Speech Volume: Normal  Handedness: Right   Mood and Affect  Mood: Euthymic  Affect: Appropriate; Congruent   Thought Process  Thought Processes: Coherent; Goal Directed; Linear  Descriptions of Associations:Intact  Orientation:Full (Time, Place and Person)  Thought Content:Logical    Hallucinations:Hallucinations: Visual Description of Visual Hallucinations: "I  saw   cascading lights and a dancing mickey mouse, and I saw a blue moon in the corner"  Ideas of Reference:None  Suicidal Thoughts:Suicidal Thoughts: Yes, Passive SI Passive Intent and/or Plan: Without Intent; Without Plan  Homicidal Thoughts:Homicidal Thoughts: Yes, Passive HI Passive Intent and/or Plan: Without Intent; Without Plan   Sensorium  Memory: Immediate Fair; Recent Fair  Judgment: Fair  Insight: Present   Executive Functions  Concentration: Good  Attention Span: Good  Recall: Good  Fund of Knowledge: Good  Language: Good   Psychomotor Activity  Psychomotor Activity: Psychomotor Activity: Normal   Assets  Assets: Communication Skills; Desire for Improvement; Housing; Physical Health; Resilience; Social Support   Sleep  Sleep: Sleep: Poor   Nutritional Assessment (For OBS and FBC admissions only) Has the patient had a weight loss or gain of 10 pounds or more in the last 3 months?: No Has the patient had a decrease in food intake/or appetite?: No Does the patient have dental problems?: No Does the patient have eating habits or behaviors that may be indicators of an eating disorder including binging or inducing vomiting?: No Has the patient recently lost weight without trying?: 0 Has the patient been eating poorly because of a decreased appetite?: 0 Malnutrition Screening Tool Score: 0    Physical Exam Vitals and nursing note reviewed.  Constitutional:      Appearance: Normal appearance. She is well-developed.  HENT:     Head: Normocephalic and atraumatic.     Nose: Nose normal.  Cardiovascular:     Rate and Rhythm: Normal rate.  Pulmonary:     Effort: Pulmonary effort is normal.  Musculoskeletal:        General: Normal range of motion.     Cervical back: Normal range of motion.  Skin:    General: Skin is warm and dry.  Neurological:     Mental Status: She is alert and oriented to person, place, and time.  Psychiatric:         Attention and Perception: Attention normal. She perceives visual hallucinations.        Mood and Affect: Mood and affect normal.        Speech: Speech normal.        Behavior: Behavior normal. Behavior is cooperative.        Thought Content: Thought content includes homicidal and suicidal ideation.        Cognition and Memory: Cognition and memory normal.    Review of Systems  Constitutional: Negative.   HENT: Negative.    Eyes: Negative.   Respiratory: Negative.    Cardiovascular: Negative.   Gastrointestinal: Negative.   Genitourinary: Negative.   Musculoskeletal: Negative.   Skin: Negative.   Neurological: Negative.   Psychiatric/Behavioral:  Positive for hallucinations, substance abuse and suicidal ideas.     Blood pressure 125/76, pulse 77, temperature 98.5 F (36.9 C), resp. rate 19, SpO2 100 %. There is no height or weight on file to calculate BMI.  Past Psychiatric History: see above   Is the patient at risk to self? Yes  Has the patient been a risk to self in the past 6 months? No .    Has the patient been a risk to self within the distant past? Yes   Is the patient a risk to others? Yes   Has the patient been a risk to others in the past 6 months? No   Has the patient been a risk to others within the distant past? Yes  Past Medical History: Seasonal allergies, Chiari malformation type I  Family History: See above  Social History: Employed, resides with family, daily marijuana use  Last Labs:  Office Visit on 10/15/2022  Component Date Value Ref Range Status   Color, UA 10/15/2022 yellow   Final   Clarity, UA 10/15/2022 clear   Final   Glucose, UA 10/15/2022 Negative  Negative Final   Bilirubin, UA 10/15/2022 positive   Final   Ketones, UA 10/15/2022 neg   Final   Spec Grav, UA 10/15/2022 >=1.030 (A)  1.010 - 1.025 Final   Blood, UA 10/15/2022 positive   Final   3+   pH, UA 10/15/2022 5.5  5.0 - 8.0 Final   Protein, UA 10/15/2022 Negative  Negative Final    Urobilinogen, UA 10/15/2022 0.2  0.2 or 1.0 E.U./dL Final   Nitrite, UA 16/08/9603 neg   Final   Leukocytes, UA 10/15/2022 Trace (A)  Negative Final   MICRO NUMBER: 10/15/2022 [id number]   Final   SPECIMEN QUALITY: 10/15/2022 Adequate   Final   Sample Source 10/15/2022 URINE   Final   STATUS: 10/15/2022 FINAL   Final   Result: 10/15/2022    Final                   Value:Mixed genital flora isolated. These superficial bacteria are not indicative of a urinary tract infection. No further organism identification is warranted on this specimen. If clinically indicated, recollect clean-catch, mid-stream urine and transfer  immediately to Urine Culture Transport Tube.    Sodium 10/15/2022 140  135 - 145 mEq/L Final   Potassium 10/15/2022 4.1  3.5 - 5.1 mEq/L Final   Chloride 10/15/2022 107  96 - 112 mEq/L Final   CO2 10/15/2022 26  19 - 32 mEq/L Final   Glucose, Bld 10/15/2022 93  70 - 99 mg/dL Final   BUN 14/78/2956 16  6 - 23 mg/dL Final   Creatinine, Ser 10/15/2022 0.81  0.40 - 1.20 mg/dL Final   GFR 21/30/8657 101.79  >60.00 mL/min Final   Calculated using the CKD-EPI Creatinine Equation (2021)   Calcium 10/15/2022 9.1  8.4 - 10.5 mg/dL Final   Cholesterol 84/69/6295 173  0 - 200 mg/dL Final   ATP III Classification       Desirable:  < 200 mg/dL               Borderline High:  200 - 239 mg/dL          High:  > = 284 mg/dL   Triglycerides 13/24/4010 78.0  0.0 - 149.0 mg/dL Final   Normal:  <272 mg/dLBorderline High:  150 - 199 mg/dL   HDL 53/66/4403 47.42  >39.00 mg/dL Final   VLDL 59/56/3875 15.6  0.0 - 40.0 mg/dL Final   LDL Cholesterol 10/15/2022 118 (H)  0 - 99 mg/dL Final   Total CHOL/HDL Ratio 10/15/2022 4   Final                  Men          Women1/2 Average Risk     3.4          3.3Average Risk          5.0          4.42X Average Risk          9.6          7.13X Average Risk          15.0  11.0                       NonHDL 10/15/2022 133.40   Final   NOTE:  Non-HDL goal  should be 30 mg/dL higher than patient's LDL goal (i.e. LDL goal of < 70 mg/dL, would have non-HDL goal of < 100 mg/dL)   Extra tube recieved 10/15/2022    Final   Comment: An extra specimen was received with no test requested. The specimen will be maintained in storage in case  additional testing is needed. Please call the client service department for further assistance. Marland Kitchen    Specimen type recieved 10/15/2022 SST   Final   EXTRA LAVENDER-TOP TUBE 10/15/2022    Final   Comment: We received an extra specimen with no test requested. If any test is desired for this specimen please call client services and advise.     Allergies: Patient has no known allergies.  Medications:  Facility Ordered Medications  Medication   acetaminophen (TYLENOL) tablet 650 mg   alum & mag hydroxide-simeth (MAALOX/MYLANTA) 200-200-20 MG/5ML suspension 30 mL   magnesium hydroxide (MILK OF MAGNESIA) suspension 30 mL   hydrOXYzine (ATARAX) tablet 25 mg   traZODone (DESYREL) tablet 50 mg   PTA Medications  Medication Sig   etonogestrel (NEXPLANON) 68 MG IMPL implant 1 each by Subdermal route once.    Medical Decision Making  Patient remains voluntary.  She will be admitted to [facility] behavioral health continuous observation while awaiting inpatient psychiatric treatment.  Current medications: -Acetaminophen 650 mg every 6 as needed/mild pain -Maalox 30 mL oral every 4 as needed/digestion -Hydroxyzine 25 mg 3 times daily as needed/anxiety -Magnesium hydroxide 30 mL daily as needed/mild constipation -Trazodone 50 mg nightly as needed/sleep  Medications: -Risperidone 1 mg twice daily/mood    Recommendations  Based on my evaluation the patient does not appear to have an emergency medical condition.  Lenard Lance, FNP 02/23/23  4:38 PM

## 2023-02-24 DIAGNOSIS — F250 Schizoaffective disorder, bipolar type: Secondary | ICD-10-CM | POA: Diagnosis not present

## 2023-02-24 LAB — PROLACTIN: Prolactin: 17.4 ng/mL (ref 4.8–33.4)

## 2023-02-24 MED ORDER — TRAZODONE HCL 50 MG PO TABS: 50.0000 mg | ORAL_TABLET | Freq: Every evening | ORAL | 0 refills | Status: AC | PRN

## 2023-02-24 MED ORDER — RISPERIDONE 1 MG PO TBDP: 1.0000 mg | ORAL_TABLET | Freq: Two times a day (BID) | ORAL | 0 refills | Status: AC

## 2023-02-24 NOTE — ED Notes (Signed)
Patient A&Ox4. Patient denies SI/HI and AVH. Patient denies any physical complaints when asked. No acute distress noted. Support and encouragement provided. Routine safety checks conducted according to facility protocol. Encouraged patient to notify staff if thoughts of harm toward self or others arise. Patient verbalize understanding and agreement. Will continue to monitor for safety.    

## 2023-02-24 NOTE — ED Notes (Signed)
Patient A&O x 4, ambulatory. Patient discharged in no acute distress. Patient denied SI/HI, A/VH upon discharge. Patient verbalized understanding of all discharge instructions explained by staff, to include follow up appointments, RX's and safety plan. Patient reported mood 10/10.  Pt belongings returned to patient from locker # 11  intact. Patient escorted to lobby via staff for transport to destination. Safety maintained.  

## 2023-02-24 NOTE — Discharge Instructions (Addendum)
Based on the information that you have provided and the presenting issues Tailored Care Management resources for have been recommended.  It is imperative that you follow through with treatment recommendations within 5-7 days from the of discharge to mitigate further risk to your safety and mental well-being. A list of referrals has been provided below to get you started.  You are not limited to the list provided.  In case of an urgent crisis, you may contact the Mobile Crisis Unit with [facility] at [phone].    Tailored Care Management  Recipient Services at [phone]  What is Tailored Care Management (TCM)? Tailored Care Management is an important part [employer]'s [employer] Plan. Tailored Care Management provides whole-person care from all health care providers. Whole person care brings together all of a person's needs, including behavioral health, physical health, pharmacy, and unmet health-related resource needs. Tailored Care Management means better health outcomes for our members.  Member Choice in TCM You have a choice in where you receive Tailored Care Management: Care Management Agencies [facility]): provider organizations with experience providing behavioral health, intellectual/developmental disability, and/or traumatic brain injury services to our population. [facility] ([facility]): primary care practices whose providers have experience providing primary care services to our population. [employer] Tailored Engineer, manufacturing who work at [employer].   Here are some things to think about when you choose how you get Tailored Care Management: The providers you are seeing now. Your specific health care needs. How serious are your physical medical needs are. Where you live.  [employer] will help match you to a Care Manager that has specialized training to meet your needs. You may change your Care Manager twice a year for any reason and at any time with a good  reason. You can choose not to have a Care Manager at any time by calling Member and Recipient Services at [phone] or completing the form in the Member Portal.  Elements of Tailored Care Management [employer] members can stop getting Tailored Care Management at any time. They will still be supported as needed through care coordination and care transitions (see Member Handbook for more information).   [employer] accepts referrals for children who may be at risk of not returning to the community or unable to maintain their community setting. Please submit information on the secure form below.  If you choose to stop Tailored Care Management We will still help those members through care coordination and care transitions who are not in Tailored Care Management. Care coordination will help with finding housing and other unmet health-related resource needs. [employer] will also help with care transitions such as moving from one clinical setting to another.  What is TCM for State-Funded Recipients? Although there will be many things that are similar to Tailored Care Management received by Medicaid members, there are some differences for state-funded recipients. State-funded recipients with the highest level of need will receive Case or Care Management.??  For recipients with behavioral health diagnoses, Case Management is available for children and adults with serious mental health or substance use disorder needs. Desiree Berger has a Financial controller for case management services. For recipients with I/DD and TBI diagnoses, [employer] provides Care Management.??  Recipients can get case management if they cannot receive Medicaid and meet certain criteria.  [employer] has a waitlist for those state-funded recipients who are waiting to receive Care or Case Management. Providers and others can suggest recipients be added to the list; please call [employer] at [phone] if you  feel you need to be placed on this list.?  Patient is instructed prior to discharge to:  Take all medications as prescribed by his/her mental healthcare provider. Report any adverse effects and or reactions from the medicines to his/her outpatient provider promptly. Keep all scheduled appointments, to ensure that you are getting refills on time and to avoid any interruption in your medication.  If you are unable to keep an appointment call to reschedule.  Be sure to follow-up with resources and follow-up appointments provided.  Patient has been instructed & cautioned: To not engage in alcohol and or illegal drug use while on prescription medicines. In the event of worsening symptoms, patient is instructed to call the crisis hotline, 911 and or go to the nearest ED for appropriate evaluation and treatment of symptoms. To follow-up with his/her primary care provider for your other medical issues, concerns and or health care needs.  Information: -National Suicide Prevention Lifeline 1-800-SUICIDE or (913)273-0114.  -988 offers 24/7 access to trained crisis counselors who can help people experiencing mental health-related distress. People can call or text 988 or chat 988lifeline.org for themselves or if they are worried about a loved one who may need crisis support.

## 2023-02-24 NOTE — ED Provider Notes (Signed)
[facility]BC/OBS ASAP Discharge Summary  Date and Time: 02/24/2023 9:35 AM  Name: Desiree Berger  MRN:  [id number]   Discharge Diagnoses:  Final diagnoses:  Schizoaffective disorder, bipolar type    Subjective: Patient states "I feel so much better than yesterday, my mood is better.  I am ready to go home."  Patient is reassessed by this nurse practitioner face-to-face.  She is reclined in observation area upon approach, appears asleep.  Chart reviewed and patient discussed with Dr. Nelly Rout on 02/20/2023.  Patient is alert and oriented, pleasant and cooperative during assessment.  She presents with euthymic mood, congruent affect.  She verbalizes readiness to discharge home.  Desiree Berger states "I think that this guy that I was seeing was not good for me and he was making me overthink things, I am not going to see him again."  Patient denies suicidal and homicidal ideations.  She denies auditory and visual hallucinations currently.  She states "my Chiari malformation sometimes causes me to see flurries of white, it stopped after I ate breakfast."  Delusional thought content no indication that patient is responding to internal stimuli.  Desiree Berger endorses plan to continue to follow-up with established outpatient psychiatry [facility].  Removed from [facility] schedule related to payment. She does have ability to satisfy payment to be returned to scheduled appointment. She will continue medications including risperidone and trazodone.  Patient plans to return to the home of her father.  She denies access to weapons.  She endorses average sleep and appetite.  She endorses plan to cease use of marijuana moving forward.  Patient offered support and encouragement.  She gives verbal consent to speak with her mother, Desiree Berger phone number [phone]. Spoke with patient's mother who denies safety concerns and verbalizes understanding of strict return precautions.   Patient and family are  educated and verbalize understanding of mental health resources and other crisis services in the community. They are instructed to call 911 and present to the nearest emergency room should patient experience any suicidal/homicidal ideation, auditory/visual/hallucinations, or detrimental worsening of mental health condition.      Stay Summary: HPI 02/23/2023- 1634pm: Patient presents voluntarily to go for [facility] behavioral health walk-in assessment.  Patient is accompanied by her brother, Catlynn Grondahl, prefers that he remain present during assessment. Patient is assessed by this nurse practitioner face-to-face.  She is seated in assessment area upon my approach, no apparent distress.  She is alert and oriented, pleasant and cooperative during assessment.  She presents with euthymic mood, congruent affect.   Desiree Berger, prefers [employer]."  Patient states "I have been seeing spots in my peripheral vision, my [facility] therapist told me if that happens I should come here."  Patient reports visual hallucinations that began today.  Patient states "I clicked my mouse and I saw cascading lights that were red and yellow and a dancing Mickey Mouse on the screen.  I also saw a blue moon in the corner but I know it was not real."  No reported auditory or command hallucinations.   She endorses passive suicidal and homicidal ideations, ongoing x 1 year.  She denies any plan or intent to harm herself or others.  She endorses history of approximately 5 previous suicide attempts.  Reports most recent suicide attempt 3 years ago when she "snorted pain medications."  She denies history of nonsuicidal self-harm behavior.  Denies history if violence toward others.    Desiree Berger is not linked with outpatient psychiatry currently.  Discontinued appointments with  [facility] for individual counseling 6 months ago related to "once I pay my fee I can go back."  She has not met with outpatient psychiatry in approximately 2 years.  She  has not been prescribed medications for more than 2 years.  She endorses history of 4 previous inpatient psychiatric hospitalizations.  Most recent psychiatric hospitalization 3 years ago at [facility] behavioral health.  Her diagnoses include generalized anxiety disorder, major depressive disorder, substance-induced mood disorder, schizoaffective disorder, bipolar type, suicidal ideation.  Family mental health history includes patient's mother and maternal grandmother, both diagnosed with anxiety.  Patient's brother diagnosed with ADHD.   Patient resides in [place] with her brother and father.  She denies access to weapons.  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  She endorses average appetite and decreased sleep.  She endorses daily marijuana use, most recent use of marijuana on last night.  She endorses rare alcohol use, typically consumes 1 drink less than 1 time per month.  She states "I do not think about alcohol much, I focus on marijuana."  She denies substance use aside from marijuana and alcohol.   Patient offered support and encouragement. Desiree Berger agrees with treatment plan to include inpatient psychiatric treatment.  Reviewed medications including risperidone, discussed potential side effects and offered opportunity to ask questions.  Patient confirms desire to remain full CODE STATUS.    Total Time spent with patient: 20 minutes  Past Psychiatric History: See above Past Medical History: See H&P Family History: See H&P Family Psychiatric History: See above Social History: Employed, resides with family member, chronic marijuana use Tobacco Cessation:  A prescription for an FDA-approved tobacco cessation medication was offered at discharge and the patient refused  Current Medications:  Current Facility-Administered Medications  Medication Dose Route Frequency Provider Last Rate Last Admin   acetaminophen (TYLENOL) tablet 650 mg  650 mg Oral Q6H PRN Lenard Lance, FNP        alum & mag hydroxide-simeth (MAALOX/MYLANTA) 200-200-20 MG/5ML suspension 30 mL  30 mL Oral Q4H PRN Lenard Lance, FNP       hydrOXYzine (ATARAX) tablet 25 mg  25 mg Oral TID PRN Lenard Lance, FNP       magnesium hydroxide (MILK OF MAGNESIA) suspension 30 mL  30 mL Oral Daily PRN Lenard Lance, FNP       risperiDONE (RISPERDAL M-TABS) disintegrating tablet 1 mg  1 mg Oral BID Lenard Lance, FNP   1 mg at 02/23/23 2247   traZODone (DESYREL) tablet 50 mg  50 mg Oral QHS PRN Lenard Lance, FNP       Current Outpatient Medications  Medication Sig Dispense Refill   etonogestrel (NEXPLANON) 68 MG IMPL implant 1 each by Subdermal route once.      PTA Medications:  Facility Ordered Medications  Medication   acetaminophen (TYLENOL) tablet 650 mg   alum & mag hydroxide-simeth (MAALOX/MYLANTA) 200-200-20 MG/5ML suspension 30 mL   magnesium hydroxide (MILK OF MAGNESIA) suspension 30 mL   hydrOXYzine (ATARAX) tablet 25 mg   traZODone (DESYREL) tablet 50 mg   risperiDONE (RISPERDAL M-TABS) disintegrating tablet 1 mg   PTA Medications  Medication Sig   etonogestrel (NEXPLANON) 68 MG IMPL implant 1 each by Subdermal route once.       10/15/2022    9:38 AM 07/16/2022    4:35 PM  Depression screen PHQ 2/9  Decreased Interest 0 1  Down, Depressed, Hopeless 0 1  PHQ - 2 Score 0  2  Altered sleeping 1 2  Tired, decreased energy 1 3  Change in appetite 0 2  Feeling bad or failure about yourself  0 1  Trouble concentrating 1 3  Moving slowly or fidgety/restless 1 2  Suicidal thoughts 0 1  PHQ-9 Score 4 16  Difficult doing work/chores Not difficult at all Somewhat difficult    Flowsheet Row ED from 02/23/2023 in [facility] ED from 06/22/2022 in [facility] from 03/07/2022 in [facility] at [facility] RISK CATEGORY Low Risk No Risk No Risk       Musculoskeletal  Strength & Muscle Tone: within  normal limits Gait & Station: normal Patient leans: N/A  Psychiatric Specialty Exam  Presentation  General Appearance:  Appropriate for Environment; Casual  Eye Contact: Good  Speech: Clear and Coherent; Normal Rate  Speech Volume: Normal  Handedness: Right   Mood and Affect  Mood: Euthymic  Affect: Appropriate; Congruent   Thought Process  Thought Processes: Coherent; Goal Directed; Linear  Descriptions of Associations:Intact  Orientation:Full (Time, Place and Person)  Thought Content:Logical; WDL  Diagnosis of Schizophrenia or Schizoaffective disorder in past: Yes    Hallucinations:Hallucinations: None Description of Visual Hallucinations: "I saw  cascading lights and a dancing mickey mouse, and I saw a blue moon in the corner"  Ideas of Reference:None  Suicidal Thoughts:Suicidal Thoughts: No SI Passive Intent and/or Plan: Without Intent; Without Plan  Homicidal Thoughts:Homicidal Thoughts: No HI Passive Intent and/or Plan: Without Intent; Without Plan   Sensorium  Memory: Immediate Good; Recent Good  Judgment: Good  Insight: Fair   Executive Functions  Concentration: Good  Attention Span: Good  Recall: Good  Fund of Knowledge: Good  Language: Good   Psychomotor Activity  Psychomotor Activity: Psychomotor Activity: Normal   Assets  Assets: Communication Skills; Desire for Improvement; Financial Resources/Insurance; Social Support; Housing; Resilience; Physical Health   Sleep  Sleep: Sleep: Fair   Nutritional Assessment (For OBS and FBC admissions only) Has the patient had a weight loss or gain of 10 pounds or more in the last 3 months?: No Has the patient had a decrease in food intake/or appetite?: No Does the patient have dental problems?: No Does the patient have eating habits or behaviors that may be indicators of an eating disorder including binging or inducing vomiting?: No Has the patient recently lost weight  without trying?: 0 Has the patient been eating poorly because of a decreased appetite?: 0 Malnutrition Screening Tool Score: 0    Physical Exam  Physical Exam Vitals and nursing note reviewed.  Constitutional:      Appearance: Normal appearance. She is well-developed.  HENT:     Head: Normocephalic and atraumatic.     Nose: Nose normal.  Cardiovascular:     Rate and Rhythm: Normal rate.  Pulmonary:     Effort: Pulmonary effort is normal.  Musculoskeletal:        General: Normal range of motion.     Cervical back: Normal range of motion.  Skin:    General: Skin is warm and dry.  Neurological:     Mental Status: She is alert and oriented to person, place, and time.  Psychiatric:        Attention and Perception: Attention and perception normal.        Mood and Affect: Mood and affect normal.        Speech: Speech normal.  Behavior: Behavior normal. Behavior is cooperative.        Thought Content: Thought content normal.        Cognition and Memory: Cognition and memory normal.    Review of Systems  Constitutional: Negative.   HENT: Negative.    Eyes: Negative.   Respiratory: Negative.    Cardiovascular: Negative.   Gastrointestinal: Negative.   Genitourinary: Negative.   Musculoskeletal: Negative.   Skin: Negative.   Neurological: Negative.   Psychiatric/Behavioral:  Positive for substance abuse.    Blood pressure (!) 108/57, pulse 61, temperature 98.2 F (36.8 C), temperature source Oral, resp. rate 17, SpO2 99 %. There is no height or weight on file to calculate BMI.  Demographic Factors:  Caucasian  Loss Factors: NA  Historical Factors: Prior suicide attempts  Risk Reduction Factors:   Sense of responsibility to family, Employed, Living with another person, especially a relative, Positive social support, Positive therapeutic relationship, and Positive coping skills or problem solving skills  Continued Clinical Symptoms:  Previous Psychiatric  Diagnoses and Treatments  Cognitive Features That Contribute To Risk:  None    Suicide Risk:  Minimal: No identifiable suicidal ideation.  Patients presenting with no risk factors but with morbid ruminations; may be classified as minimal risk based on the severity of the depressive symptoms  Plan Of Care/Follow-up recommendations:  Follow-up with established outpatient psychiatry.  Additional outpatient psychiatry resources provided. Medications: -Risperidone M-Tab 1 mg twice daily/mood -Trazodone 50 mg nightly as needed/sleep  Disposition: Discharge  Lenard Lance, FNP 02/24/2023, 9:35 AM

## 2023-02-24 NOTE — ED Notes (Signed)
Pt was given a sandwich, chips, and juice for lunch.  

## 2023-02-24 NOTE — ED Notes (Signed)
Pt sleeping@this time. Breathing even and unlabored. Will continue to monitor for safety 

## 2023-02-25 ENCOUNTER — Telehealth: Payer: Self-pay

## 2023-02-25 NOTE — Transitions of Care (Post Inpatient/ED Visit) (Signed)
   02/25/2023  Name: Myosha Cuadras MRN: [id number] DOB: [birth year]  Today's TOC FU Call Status: Today's TOC FU Call Status:: Successful TOC FU Call Competed TOC FU Call Complete Date: 02/25/23  Transition Care Management Follow-up Telephone Call Date of Discharge: 02/24/23 Discharge Facility: [facility]) Type of Discharge: Emergency Department How have you been since you were released from the hospital?: Better  Items Reviewed: Did you receive and understand the discharge instructions provided?: Yes Medications obtained and verified?: Yes (Medications Reviewed) Any new allergies since your discharge?: No Dietary orders reviewed?: NA Do you have support at home?: Yes People in Home: parent(s), sibling(s)  Home Care and Equipment/Supplies: Were Home Health Services Ordered?: NA Any new equipment or medical supplies ordered?: NA  Functional Questionnaire: Do you need assistance with bathing/showering or dressing?: No Do you need assistance with meal preparation?: No Do you need assistance with eating?: No Do you have difficulty maintaining continence: No Do you need assistance with getting out of bed/getting out of a chair/moving?: No Do you have difficulty managing or taking your medications?: No  Follow up appointments reviewed: PCP Follow-up appointment confirmed?: NA Specialist Hospital Follow-up appointment confirmed?: No Do you need transportation to your follow-up appointment?: No Do you understand care options if your condition(s) worsen?: Yes-patient verbalized understanding    SIGNATURE Arvil Persons, BSN, RN

## 2023-03-04 ENCOUNTER — Other Ambulatory Visit: Payer: Self-pay

## 2023-03-04 ENCOUNTER — Emergency Department (HOSPITAL_COMMUNITY): Payer: No Typology Code available for payment source

## 2023-03-04 ENCOUNTER — Emergency Department (HOSPITAL_COMMUNITY)
Admission: EM | Admit: 2023-03-04 | Discharge: 2023-03-05 | Disposition: A | Discharge status: Home / Self Care | Payer: No Typology Code available for payment source | Attending: Emergency Medicine | Admitting: Emergency Medicine

## 2023-03-04 DIAGNOSIS — M79601 Pain in right arm: Secondary | ICD-10-CM | POA: Insufficient documentation

## 2023-03-04 DIAGNOSIS — R079 Chest pain, unspecified: Secondary | ICD-10-CM | POA: Diagnosis present

## 2023-03-04 DIAGNOSIS — Y92410 Unspecified street and highway as the place of occurrence of the external cause: Secondary | ICD-10-CM | POA: Insufficient documentation

## 2023-03-04 DIAGNOSIS — R109 Unspecified abdominal pain: Secondary | ICD-10-CM | POA: Diagnosis not present

## 2023-03-04 DIAGNOSIS — S2221XA Fracture of manubrium, initial encounter for closed fracture: Secondary | ICD-10-CM | POA: Diagnosis not present

## 2023-03-04 LAB — I-STAT CHEM 8, ED
BUN: 9 mg/dL (ref 6–20)
Calcium, Ion: 1.12 mmol/L — ABNORMAL LOW (ref 1.15–1.40)
Chloride: 106 mmol/L (ref 98–111)
Creatinine, Ser: 0.7 mg/dL (ref 0.44–1.00)
Glucose, Bld: 102 mg/dL — ABNORMAL HIGH (ref 70–99)
HCT: 42 % (ref 36.0–46.0)
Hemoglobin: 14.3 g/dL (ref 12.0–15.0)
Potassium: 3.6 mmol/L (ref 3.5–5.1)
Sodium: 140 mmol/L (ref 135–145)
TCO2: 21 mmol/L — ABNORMAL LOW (ref 22–32)

## 2023-03-04 LAB — COMPREHENSIVE METABOLIC PANEL
ALT: 17 U/L (ref 0–44)
AST: 24 U/L (ref 15–41)
Albumin: 3.8 g/dL (ref 3.5–5.0)
Alkaline Phosphatase: 36 U/L — ABNORMAL LOW (ref 38–126)
Anion gap: 8 (ref 5–15)
BUN: 8 mg/dL (ref 6–20)
CO2: 21 mmol/L — ABNORMAL LOW (ref 22–32)
Calcium: 8.6 mg/dL — ABNORMAL LOW (ref 8.9–10.3)
Chloride: 108 mmol/L (ref 98–111)
Creatinine, Ser: 0.82 mg/dL (ref 0.44–1.00)
GFR, Estimated: >60 mL/min (ref >60)
Glucose, Bld: 104 mg/dL — ABNORMAL HIGH (ref 70–99)
Potassium: 3.6 mmol/L (ref 3.5–5.1)
Sodium: 137 mmol/L (ref 135–145)
Total Bilirubin: 0.3 mg/dL (ref 0.3–1.2)
Total Protein: 6.2 g/dL — ABNORMAL LOW (ref 6.5–8.1)

## 2023-03-04 LAB — CBC
HCT: 42.4 % (ref 36.0–46.0)
Hemoglobin: 14.1 g/dL (ref 12.0–15.0)
MCH: 31.1 pg (ref 26.0–34.0)
MCHC: 33.3 g/dL (ref 30.0–36.0)
MCV: 93.4 fL (ref 80.0–100.0)
Platelets: 285 10*3/uL (ref 150–400)
RBC: 4.54 MIL/uL (ref 3.87–5.11)
RDW: 12 % (ref 11.5–15.5)
WBC: 14.4 10*3/uL — ABNORMAL HIGH (ref 4.0–10.5)
nRBC: 0 % (ref 0.0–0.2)

## 2023-03-04 LAB — URINALYSIS, ROUTINE W REFLEX MICROSCOPIC
Bacteria, UA: NONE SEEN
Bilirubin Urine: NEGATIVE
Glucose, UA: NEGATIVE mg/dL
Ketones, ur: NEGATIVE mg/dL
Leukocytes,Ua: NEGATIVE
Nitrite: NEGATIVE
Protein, ur: NEGATIVE mg/dL
Specific Gravity, Urine: 1.009 (ref 1.005–1.030)
pH: 6 (ref 5.0–8.0)

## 2023-03-04 LAB — I-STAT BETA HCG BLOOD, ED (MC, WL, AP ONLY): I-stat hCG, quantitative: <5 m[IU]/mL (ref <5)

## 2023-03-04 LAB — PROTIME-INR
INR: 1 (ref 0.8–1.2)
Prothrombin Time: 12.9 seconds (ref 11.4–15.2)

## 2023-03-04 MED ORDER — LACTATED RINGERS IV BOLUS
1000.0000 mL | Freq: Once | INTRAVENOUS | Status: AC
  Administered 2023-03-04: 1000 mL via INTRAVENOUS

## 2023-03-04 MED ORDER — LORAZEPAM 2 MG/ML IJ SOLN
1.0000 mg | Freq: Once | INTRAMUSCULAR | Status: AC
  Administered 2023-03-04: 1 mg via INTRAVENOUS
  Filled 2023-03-04: qty 1

## 2023-03-04 MED ORDER — KETOROLAC TROMETHAMINE 15 MG/ML IJ SOLN
15.0000 mg | Freq: Once | INTRAMUSCULAR | Status: AC
  Administered 2023-03-04: 15 mg via INTRAVENOUS
  Filled 2023-03-04: qty 1

## 2023-03-04 MED ORDER — IOHEXOL 350 MG/ML SOLN
75.0000 mL | Freq: Once | INTRAVENOUS | Status: AC | PRN
  Administered 2023-03-04: 75 mL via INTRAVENOUS
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
## 2023-03-05 ENCOUNTER — Telehealth: Payer: Self-pay

## 2023-03-05 MED ORDER — HYDROCODONE-ACETAMINOPHEN 5-325 MG PO TABS: 1.0000 | ORAL_TABLET | Freq: Four times a day (QID) | ORAL | 0 refills | Status: DC | PRN

## 2023-03-05 MED ORDER — IBUPROFEN 400 MG PO TABS: 400.0000 mg | ORAL_TABLET | Freq: Three times a day (TID) | ORAL | 0 refills | Status: AC
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
## 2024-02-12 IMAGING — MR MR HEAD W/O CM
6 of 10 series · 27 of 48 positions shown · non-contrast
Comparison: CT head 02/17/2022

CLINICAL DATA: Chiari malformation, sensitive to light and sound
after [place]

EXAM:
MRI HEAD WITHOUT CONTRAST
TECHNIQUE: Multiplanar, multiecho pulse sequences of the brain and surrounding
structures were obtained without intravenous contrast.

[Series 3: DWI · axial · 3.0mm · 0.94mm/px · z∈[-109,+30]mm · 8 of 96 slices shown (1 of 2)]
[im 1/96]
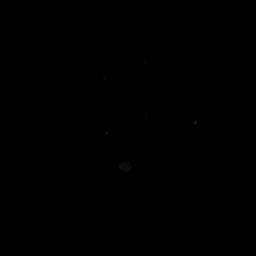
[im 11/96]
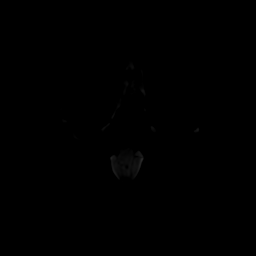
[im 32/96]
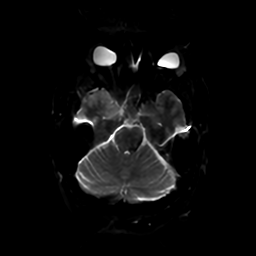
[im 43/96]
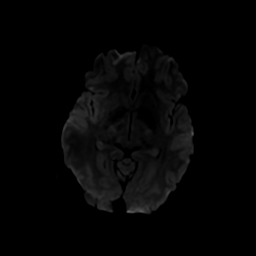
[im 53/96]
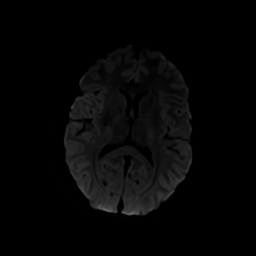
[im 64/96]
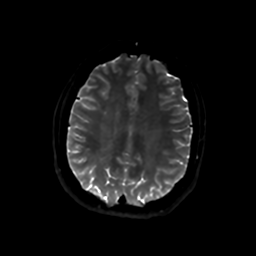
[im 85/96]
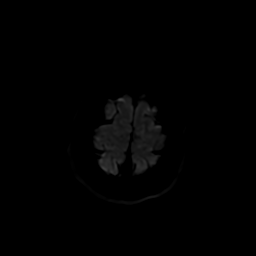
[im 96/96]
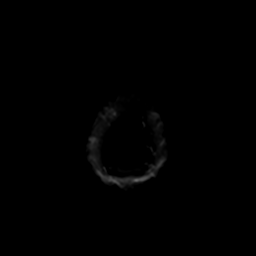

[Series 4: DWI · coronal · 4.0mm · 0.94mm/px · 6 of 69 slices shown (2 of 2)]
[im 1/69]
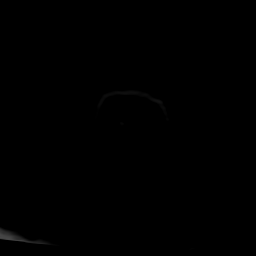
[im 14/69]
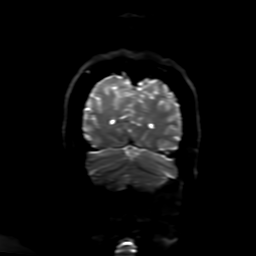
[im 28/69]
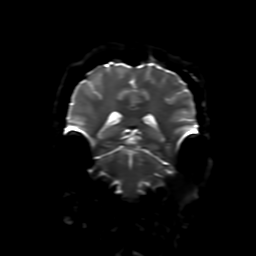
[im 41/69]
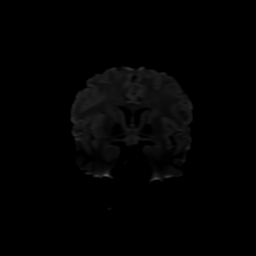
[im 55/69]
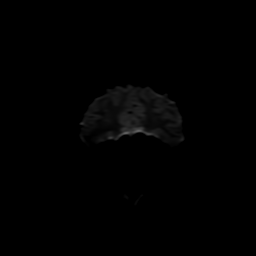
[im 69/69]
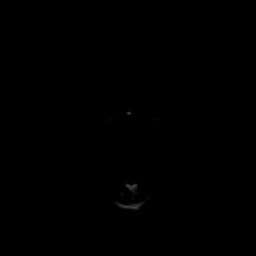

[Series 5: FLAIR · sagittal · 5.0mm · 0.23mm/px · 2 of 23 slices shown (1 of 2)]
[im 1/23]
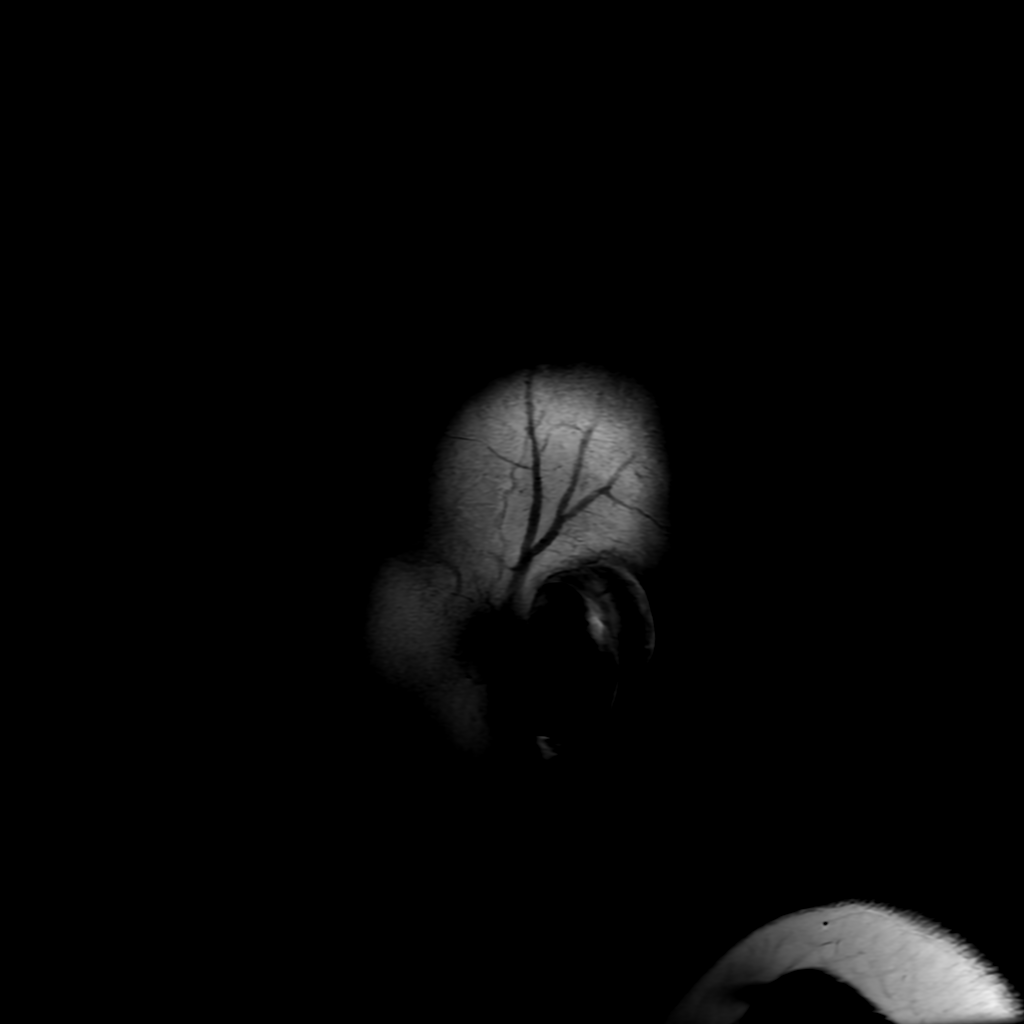
[im 23/23]
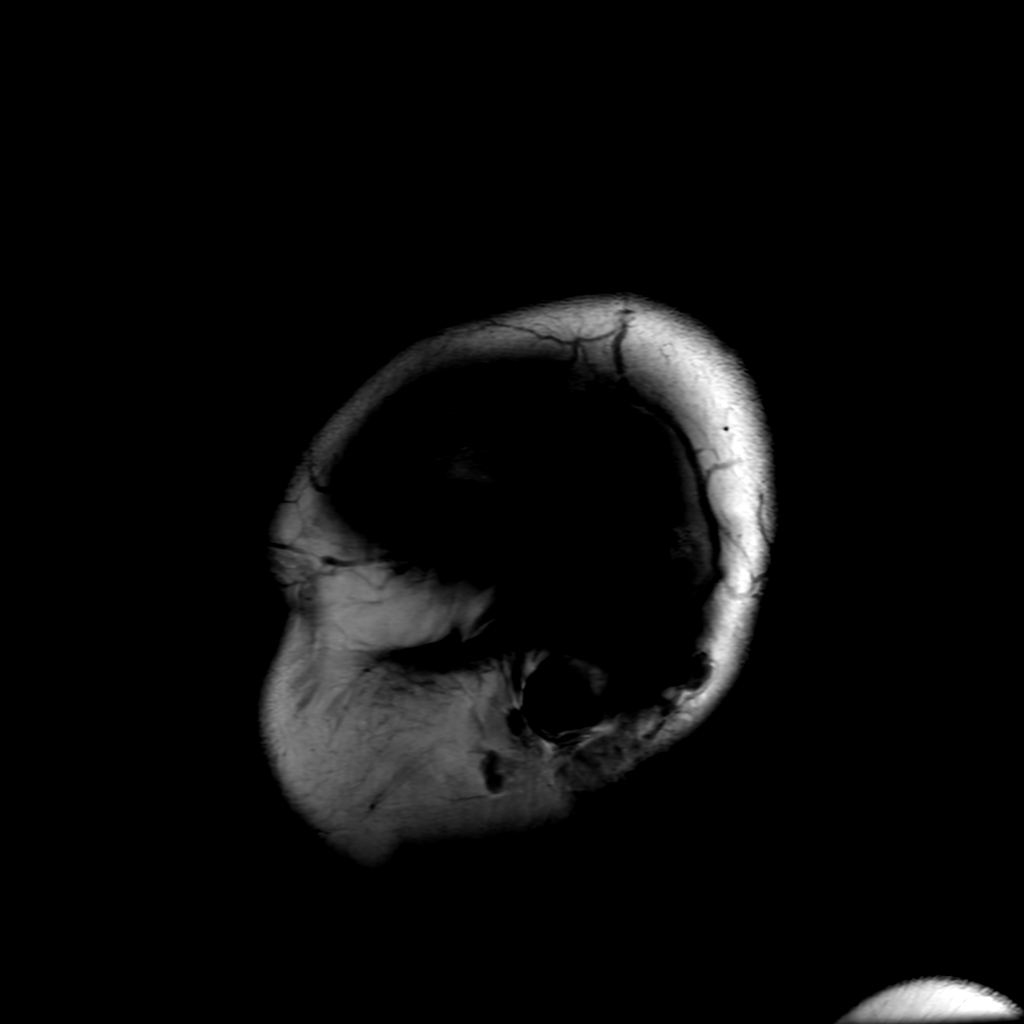

[Series 7: FLAIR · axial · 4.0mm · 0.45mm/px · z∈[-106,+35]mm · 3 of 34 slices shown (2 of 2)]
[im 1/34]
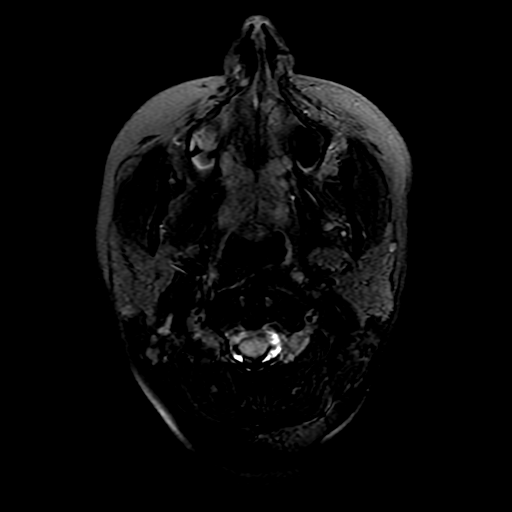
[im 17/34]
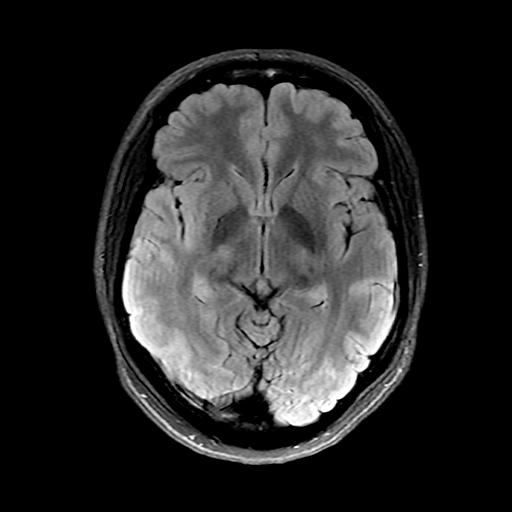
[im 34/34]
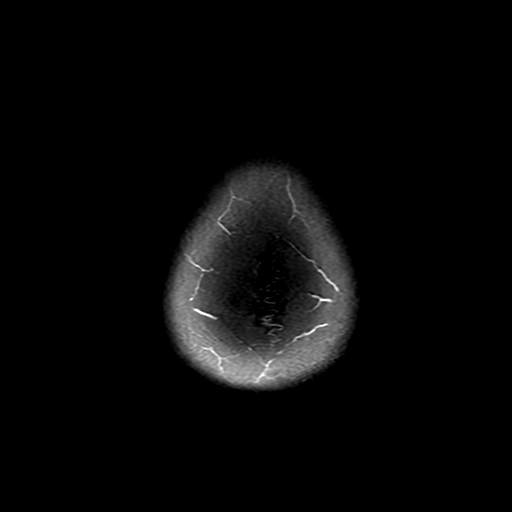

[Series 350: ADC · axial · 3.0mm · 0.94mm/px · z∈[-109,+30]mm · 5 of 48 slices shown (1 of 2)]
[im 1/48]
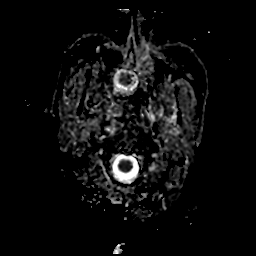
[im 12/48]
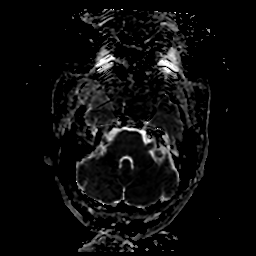
[im 24/48]
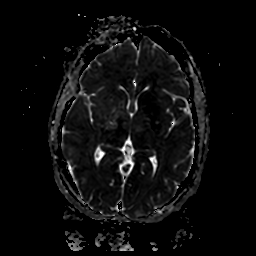
[im 36/48]
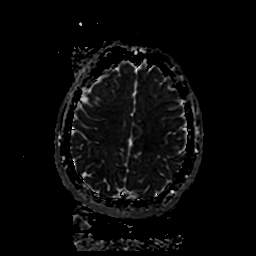
[im 48/48]
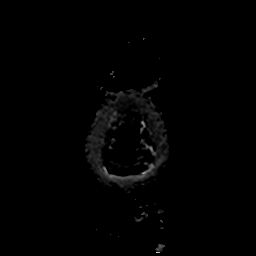

[Series 450: ADC · coronal · 4.0mm · 0.94mm/px · 3 of 35 slices shown (2 of 2)]
[im 1/35]
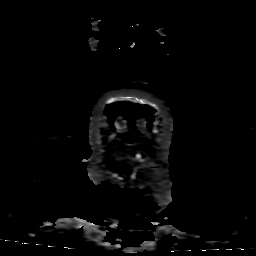
[im 18/35]
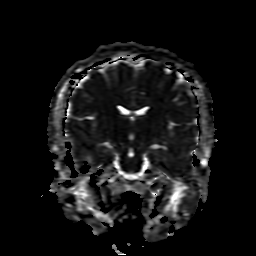
[im 35/35]
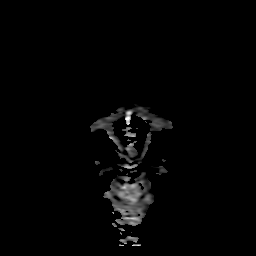

[27 of 48 positions shown; findings below may reference images not displayed]

FINDINGS: Brain: There is no evidence of acute intracranial hemorrhage,
extra-axial fluid collection, or acute infarct.

Parenchymal volume is normal. The ventricles are normal in size.
Gray-white differentiation is preserved.

There is no mass lesion.  There is no mass effect or midline shift.

There is 1.1 cm inferior cerebellar tonsillar descent, grossly
similar to the prior CT, though suboptimally assessed on the prior
study. There is crowding of the foramen magnum with mild flattening
of the brainstem. There is no signal abnormality in the imaged
brainstem/upper cervical cord.

Vascular: The major arterial flow voids are present. The right
transverse sinuses larger than the left, likely a congenital
variant.

Skull and upper cervical spine: Normal marrow signal.

Sinuses/Orbits: Paranasal sinuses are clear. The globes and orbits
are unremarkable.

Other: None.
IMPRESSION: 1. 1.1 cm inferior cerebellar tonsillar descent with crowding of the
foramen magnum and mild flattening of the brainstem but without
signal abnormality in the brainstem or imaged cervical cord. This
may reflect Chiari 1 malformation (favored). Intracranial
hypotension could have a similar appearance, but there are no other
convincing findings of intracranial hypotension. Postcontrast brain
MRI may be considered to evaluate for dural enhancement.
Alternatively, MRI of the spine or lumbar puncture could be
considered for further evaluation as indicated.
2. Otherwise, unremarkable brain MRI.

## 2024-07-21 ENCOUNTER — Encounter (HOSPITAL_COMMUNITY): Payer: Self-pay

## 2024-07-21 ENCOUNTER — Ambulatory Visit (HOSPITAL_COMMUNITY)
Admission: EM | Admit: 2024-07-21 | Discharge: 2024-07-21 | Disposition: A | Discharge status: Home / Self Care | Attending: Emergency Medicine | Admitting: Emergency Medicine

## 2024-07-21 DIAGNOSIS — U071 COVID-19: Secondary | ICD-10-CM

## 2024-07-21 DIAGNOSIS — R051 Acute cough: Secondary | ICD-10-CM

## 2024-07-21 LAB — POC COVID19/FLU A&B COMBO
Covid Antigen, POC: POSITIVE — AB
Influenza A Antigen, POC: NEGATIVE
Influenza B Antigen, POC: NEGATIVE

## 2024-07-21 NOTE — ED Triage Notes (Addendum)
 Cough,nasal congestion and fever that started this morning. She has taken [facility] OTC cough and cold.
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
## 2024-07-21 NOTE — Discharge Instructions (Addendum)
 You tested positive for COVID-19, this is a viral illness that typically last 5 to 7 days.  To help with fever, body aches and chills alternate between 800 mg of ibuprofen  and 500 mg of Tylenol  every 4-6 hours.  Warm saline gargles, tea with honey and over-the-counter cough drops can help soothe sore throat.  1200 mg of Mucinex daily will loosen secretions in addition to at least 64 ounces of water.  Symptoms should improve over the next week, if no improvement or any changes return to clinic for reevaluation.

## 2024-11-28 ENCOUNTER — Emergency Department (HOSPITAL_COMMUNITY)

## 2024-11-28 ENCOUNTER — Other Ambulatory Visit: Payer: Self-pay

## 2024-11-28 ENCOUNTER — Ambulatory Visit: Admission: EM | Admit: 2024-11-28 | Discharge: 2024-11-28

## 2024-11-28 ENCOUNTER — Encounter: Payer: Self-pay | Admitting: Emergency Medicine

## 2024-11-28 ENCOUNTER — Observation Stay (HOSPITAL_COMMUNITY)
Admission: EM | Admit: 2024-11-28 | Discharge: 2024-11-30 | Disposition: A | Discharge status: Home / Self Care | Source: Ambulatory Visit | Attending: Internal Medicine | Admitting: Internal Medicine

## 2024-11-28 DIAGNOSIS — J45909 Unspecified asthma, uncomplicated: Secondary | ICD-10-CM | POA: Diagnosis not present

## 2024-11-28 DIAGNOSIS — L03317 Cellulitis of buttock: Secondary | ICD-10-CM | POA: Diagnosis not present

## 2024-11-28 DIAGNOSIS — Z6841 Body Mass Index (BMI) 40.0 and over, adult: Secondary | ICD-10-CM | POA: Diagnosis not present

## 2024-11-28 DIAGNOSIS — Z79899 Other long term (current) drug therapy: Secondary | ICD-10-CM | POA: Diagnosis not present

## 2024-11-28 DIAGNOSIS — F259 Schizoaffective disorder, unspecified: Secondary | ICD-10-CM | POA: Diagnosis not present

## 2024-11-28 DIAGNOSIS — L0231 Cutaneous abscess of buttock: Secondary | ICD-10-CM

## 2024-11-28 DIAGNOSIS — G4700 Insomnia, unspecified: Secondary | ICD-10-CM | POA: Insufficient documentation

## 2024-11-28 DIAGNOSIS — Z87891 Personal history of nicotine dependence: Secondary | ICD-10-CM | POA: Diagnosis not present

## 2024-11-28 DIAGNOSIS — E66813 Obesity, class 3: Secondary | ICD-10-CM | POA: Insufficient documentation

## 2024-11-28 DIAGNOSIS — F250 Schizoaffective disorder, bipolar type: Secondary | ICD-10-CM | POA: Diagnosis not present

## 2024-11-28 DIAGNOSIS — F39 Unspecified mood [affective] disorder: Secondary | ICD-10-CM | POA: Insufficient documentation

## 2024-11-28 DIAGNOSIS — J309 Allergic rhinitis, unspecified: Secondary | ICD-10-CM | POA: Diagnosis not present

## 2024-11-28 DIAGNOSIS — L0291 Cutaneous abscess, unspecified: Secondary | ICD-10-CM

## 2024-11-28 LAB — CBC WITH DIFFERENTIAL/PLATELET
Abs Immature Granulocytes: 0.09 10*3/uL — ABNORMAL HIGH (ref 0.00–0.07)
Basophils Absolute: 0 10*3/uL (ref 0.0–0.1)
Basophils Relative: 0 %
Eosinophils Absolute: 0.1 10*3/uL (ref 0.0–0.5)
Eosinophils Relative: 1 %
HCT: 40.1 % (ref 36.0–46.0)
Hemoglobin: 13.6 g/dL (ref 12.0–15.0)
Immature Granulocytes: 1 %
Lymphocytes Relative: 14 %
Lymphs Abs: 2.4 10*3/uL (ref 0.7–4.0)
MCH: 31.1 pg (ref 26.0–34.0)
MCHC: 33.9 g/dL (ref 30.0–36.0)
MCV: 91.6 fL (ref 80.0–100.0)
Monocytes Absolute: 1.4 10*3/uL — ABNORMAL HIGH (ref 0.1–1.0)
Monocytes Relative: 8 %
Neutro Abs: 13.8 10*3/uL — ABNORMAL HIGH (ref 1.7–7.7)
Neutrophils Relative %: 76 %
Platelets: 336 10*3/uL (ref 150–400)
RBC: 4.38 MIL/uL (ref 3.87–5.11)
RDW: 12.8 % (ref 11.5–15.5)
WBC: 17.8 10*3/uL — ABNORMAL HIGH (ref 4.0–10.5)
nRBC: 0 % (ref 0.0–0.2)

## 2024-11-28 LAB — COMPREHENSIVE METABOLIC PANEL WITH GFR
ALT: 10 U/L (ref 0–44)
AST: 13 U/L — ABNORMAL LOW (ref 15–41)
Albumin: 4.1 g/dL (ref 3.5–5.0)
Alkaline Phosphatase: 66 U/L (ref 38–126)
Anion gap: 11 (ref 5–15)
BUN: 9 mg/dL (ref 6–20)
CO2: 21 mmol/L — ABNORMAL LOW (ref 22–32)
Calcium: 9.3 mg/dL (ref 8.9–10.3)
Chloride: 103 mmol/L (ref 98–111)
Creatinine, Ser: 0.67 mg/dL (ref 0.44–1.00)
GFR, Estimated: >60 mL/min
Glucose, Bld: 82 mg/dL (ref 70–99)
Potassium: 4 mmol/L (ref 3.5–5.1)
Sodium: 136 mmol/L (ref 135–145)
Total Bilirubin: 0.4 mg/dL (ref 0.0–1.2)
Total Protein: 7.2 g/dL (ref 6.5–8.1)

## 2024-11-28 LAB — HCG, SERUM, QUALITATIVE: Preg, Serum: NEGATIVE

## 2024-11-28 LAB — LACTIC ACID, PLASMA: Lactic Acid, Venous: 0.8 mmol/L (ref 0.5–1.9)

## 2024-11-28 MED ORDER — OXYCODONE HCL 5 MG PO TABS: 5.0000 mg | ORAL_TABLET | Freq: Four times a day (QID) | ORAL | Status: DC | PRN

## 2024-11-28 MED ORDER — ONDANSETRON HCL 4 MG/2ML IJ SOLN: 4.0000 mg | Freq: Four times a day (QID) | INTRAMUSCULAR | Status: DC | PRN

## 2024-11-28 MED ORDER — VANCOMYCIN HCL 2000 MG/400ML IV SOLN
2000.0000 mg | Freq: Once | INTRAVENOUS | Status: AC
  Administered 2024-11-28: 2000 mg via INTRAVENOUS
  Filled 2024-11-28: qty 400

## 2024-11-28 MED ORDER — ENOXAPARIN SODIUM 60 MG/0.6ML IJ SOSY
60.0000 mg | PREFILLED_SYRINGE | INTRAMUSCULAR | Status: DC
  Administered 2024-11-28 – 2024-11-29 (×2): 60 mg via SUBCUTANEOUS
  Filled 2024-11-28 (×2): qty 0.6

## 2024-11-28 MED ORDER — IOHEXOL 300 MG/ML  SOLN
100.0000 mL | Freq: Once | INTRAMUSCULAR | Status: AC | PRN
  Administered 2024-11-28: 100 mL via INTRAVENOUS

## 2024-11-28 MED ORDER — LACTATED RINGERS IV BOLUS
1000.0000 mL | Freq: Once | INTRAVENOUS | Status: AC
  Administered 2024-11-28: 1000 mL via INTRAVENOUS

## 2024-11-28 MED ORDER — TRAZODONE HCL 50 MG PO TABS
50.0000 mg | ORAL_TABLET | Freq: Every evening | ORAL | Status: DC | PRN
  Administered 2024-11-28: 50 mg via ORAL
  Filled 2024-11-28: qty 1

## 2024-11-28 MED ORDER — BISACODYL 5 MG PO TBEC: 5.0000 mg | DELAYED_RELEASE_TABLET | Freq: Every day | ORAL | Status: DC | PRN

## 2024-11-28 MED ORDER — ACETAMINOPHEN 650 MG RE SUPP: 650.0000 mg | Freq: Four times a day (QID) | RECTAL | Status: DC | PRN

## 2024-11-28 MED ORDER — VANCOMYCIN HCL IN DEXTROSE 1-5 GM/200ML-% IV SOLN: 1000.0000 mg | Freq: Once | INTRAVENOUS | Status: DC

## 2024-11-28 MED ORDER — SENNOSIDES-DOCUSATE SODIUM 8.6-50 MG PO TABS: 1.0000 | ORAL_TABLET | Freq: Every evening | ORAL | Status: DC | PRN

## 2024-11-28 MED ORDER — KETOROLAC TROMETHAMINE 15 MG/ML IJ SOLN
15.0000 mg | Freq: Once | INTRAMUSCULAR | Status: AC
  Administered 2024-11-28: 15 mg via INTRAVENOUS
  Filled 2024-11-28: qty 1

## 2024-11-28 MED ORDER — LAMOTRIGINE 25 MG PO TABS
50.0000 mg | ORAL_TABLET | Freq: Every day | ORAL | Status: DC
  Administered 2024-11-29 – 2024-11-30 (×2): 50 mg via ORAL
  Filled 2024-11-28 (×2): qty 2

## 2024-11-28 MED ORDER — ONDANSETRON HCL 4 MG PO TABS: 4.0000 mg | ORAL_TABLET | Freq: Four times a day (QID) | ORAL | Status: DC | PRN

## 2024-11-28 MED ORDER — MORPHINE SULFATE (PF) 4 MG/ML IV SOLN
4.0000 mg | Freq: Once | INTRAVENOUS | Status: DC
  Filled 2024-11-28: qty 1

## 2024-11-28 MED ORDER — ACETAMINOPHEN 500 MG PO TABS
1000.0000 mg | ORAL_TABLET | Freq: Once | ORAL | Status: AC
  Administered 2024-11-28: 1000 mg via ORAL
  Filled 2024-11-28: qty 2

## 2024-11-28 MED ORDER — RISPERIDONE 1 MG PO TBDP
1.0000 mg | ORAL_TABLET | Freq: Two times a day (BID) | ORAL | Status: DC
  Administered 2024-11-28 – 2024-11-30 (×4): 1 mg via ORAL
  Filled 2024-11-28 (×4): qty 1

## 2024-11-28 MED ORDER — ACETAMINOPHEN 325 MG PO TABS: 650.0000 mg | ORAL_TABLET | Freq: Four times a day (QID) | ORAL | Status: DC | PRN

## 2024-11-28 NOTE — ED Notes (Signed)
 Patient is being discharged from the Urgent Care and sent to the Emergency Department via POV . Per Andrea Glatter NP, patient is in need of higher level of care due to large abscess on buttock . Patient is aware and verbalizes understanding of plan of care.  Vitals:   11/28/24 1208  BP: 112/77  Pulse: 98  Resp: 17  Temp: 98.5 F (36.9 C)  SpO2: 98%

## 2024-11-28 NOTE — ED Provider Notes (Signed)
 " [place] EM[facility]ENC[facility] AT [facility] Provider Note   CSN: [id number] Arrival date & time: 11/28/24  1311     Patient presents with: No chief complaint on file.   Desiree Berger is a 29 y.o. female.   29 year old female presenting with abscess/cellulitis.  Patient notes that she had a bump on her left buttocks on Sunday which was itchy, she scratched this and since that time it has been become more painful/red/warm to the touch, she has not been able to check her temperature at home but has had tactile fevers as well as chills off and on.  She was seen urgent care today and instructed to come to the emergency department for further evaluation given concern of abscess/cellulitis.  Patient notes that this abscess has been draining a lot today.  No history of prior/recurrent abscesses, no history of immunocompromising conditions.        Prior to Admission medications  Medication Sig Start Date End Date Taking? Authorizing Provider  etonogestrel (NEXPLANON) 68 MG IMPL implant 1 each by Subdermal route once.    [provider]  HYDROcodone -acetaminophen  (NORCO/VICODIN) 5-325 MG tablet Take 1 tablet by mouth every 6 (six) hours as needed. 03/05/23   Garrick Charleston, MD  lamoTRIgine  (LAMICTAL ) 25 MG tablet Take 50 mg by mouth daily. 11/06/24   [provider]  risperiDONE  (RISPERDAL  M-TABS) 1 MG disintegrating tablet Take 1 tablet (1 mg total) by mouth 2 (two) times daily. 02/24/23   [name], FNP  traZODone  (DESYREL ) 50 MG tablet Take 1 tablet (50 mg total) by mouth at bedtime as needed for sleep. 02/24/23   [name], FNP    Allergies: Patient has no known allergies.    Review of Systems  Updated Vital Signs  Vitals:   11/28/24 1320 11/28/24 1625 11/28/24 1750  BP: 138/80  122/71  Pulse: 89  86  Resp: 16  18  Temp: 99.6 F (37.6 C) 99.9 F (37.7 C) 97.6 F (36.4 C)  TempSrc: Oral Oral Oral  SpO2: 99%  99%      Physical Exam Vitals and nursing note reviewed.  Constitutional:      General: She is not in acute distress.    Appearance: Normal appearance. She is not ill-appearing or toxic-appearing.  HENT:     Head: Normocephalic and atraumatic.  Eyes:     Extraocular Movements: Extraocular movements intact.     Pupils: Pupils are equal, round, and reactive to light.  Cardiovascular:     Rate and Rhythm: Normal rate.  Pulmonary:     Effort: Pulmonary effort is normal.  Musculoskeletal:     Cervical back: Normal range of motion.     Comments: Moves all extremities spontaneously without difficulty  Skin:    General: Skin is warm and dry.     Comments: Notable erythema/warmth surrounding abscess of left buttocks, actively draining a bloody/purulent material, see photo  Neurological:     General: No focal deficit present.     Mental Status: She is alert and oriented to person, place, and time.     (all labs ordered are listed, but only abnormal results are displayed) Labs Reviewed  COMPREHENSIVE METABOLIC PANEL WITH GFR - Abnormal; Notable for the following components:      Result Value   CO2 21 (*)    AST 13 (*)    All other components within normal limits  CBC WITH DIFFERENTIAL/PLATELET - Abnormal; Notable for the following components:   WBC  17.8 (*)    Neutro Abs 13.8 (*)    Monocytes Absolute 1.4 (*)    Abs Immature Granulocytes 0.09 (*)    All other components within normal limits  CULTURE, BLOOD (ROUTINE X 2)  CULTURE, BLOOD (ROUTINE X 2)  HCG, SERUM, QUALITATIVE  LACTIC ACID, PLASMA  LACTIC ACID, PLASMA    EKG: None  Radiology: CT PELVIS W CONTRAST Result Date: 11/28/2024 EXAM: CT Pelvis, With IV Contrast 11/28/2024 06:53:11 PM TECHNIQUE: Axial images were acquired through the pelvis with IV contrast. 100mL iohexol  (OMNIPAQUE ) 300 MG/ML solution was administered intravenously. Reformatted images were reviewed. Automated exposure control, iterative reconstruction, and/or  weight based adjustment of the mA/kV was utilized to reduce the radiation dose to as low as reasonably achievable. COMPARISON: CT chest abdomen and pelvis 03/04/2023 and pelvic radiographs 03/04/2023. CLINICAL HISTORY: L buttocks abscess with surrounding cellulitis, concern for deep extension of infection. Left buttocks abscess with surrounding cellulitis, concern for deep extension of infection. FINDINGS: BONES: No acute bony abnormalities. No cortical destruction or sclerosis to suggest osteomyelitis. JOINTS: No dislocation. The joint spaces are normal. SOFT TISSUES: Soft tissue infiltration in the left medial gluteal fat to the left of the gluteal crease with mild skin thickening consistent with history of cellulitis. No distinct fistula is identified. No loculated collection to suggest an abscess. INTRAPELVIC CONTENTS: Visualized portions of small and large bowel are mostly decompressed. No wall thickening or inflammatory stranding are identified. The appendix is normal. The uterus and ovaries are not enlarged. The bladder is normal. No free air or free fluid in the abdomen. IMPRESSION: 1. Left buttock cellulitis without evidence of abscess or fistula. 2. No CT evidence of osteomyelitis. Electronically signed by: Elsie Gravely MD 11/28/2024 07:28 PM EST RP Workstation: [id number]     Procedures   Medications Ordered in the ED  ketorolac  (TORADOL ) 15 MG/ML injection 15 mg (15 mg Intravenous Given 11/28/24 1800)  lactated ringers  bolus 1,000 mL (1,000 mLs Intravenous New Bag/Given 11/28/24 1801)  vancomycin  (VANCOREADY) IVPB 2000 mg/400 mL (2,000 mg Intravenous New Bag/Given 11/28/24 1802)  iohexol  (OMNIPAQUE ) 300 MG/ML solution 100 mL (100 mLs Intravenous Contrast Given 11/28/24 1842)  acetaminophen  (TYLENOL ) tablet 1,000 mg (1,000 mg Oral Given 11/28/24 1943)                                    Medical Decision Making This patient presents to the ED for concern of cellulitis/abscess, this involves  an extensive number of treatment options, and is a complaint that carries with it a high risk of complications and morbidity.  The differential diagnosis includes cellulitis, abscess, erysipelas, sepsis, other deep space skin infection   Co morbidities that complicate the patient evaluation  Major Depressive disorder, GAD   Additional history obtained:  Additional history obtained from record review External records from outside source obtained and reviewed including urgent care note from earlier today   Lab Tests:  I Ordered, and personally interpreted labs.  The pertinent results include: CBC notable for leukocytosis with white blood cell count of 17.8 with left shift.  CMP largely unremarkable, AST mildly diminished at 13.  Serum hCG negative.  Lactic normal, 0.8.  Blood cultures obtained   Imaging Studies ordered:  I ordered imaging studies including CT pelvis with contrast I independently visualized and interpreted imaging which showed 1. Left buttock cellulitis without evidence of abscess or fistula. 2. No CT evidence of osteomyelitis.  I agree with the radiologist interpretation   Cardiac Monitoring: / EKG:  The patient was maintained on a cardiac monitor.  I personally viewed and interpreted the cardiac monitored which showed an underlying rhythm of: NSR   Consultations Obtained:  I requested consultation with the hospitalist,  and discussed lab and imaging findings as well as pertinent plan - they recommend: I spoke with Dr. Lou who agrees that this patient is appropriate for admission for continued IV antibiotics and assessment of her response to treatment   Problem List / ED Course / Critical interventions / Medication management  I ordered medication including Toradol  for pain, IV vancomycin  for cellulitis/abscess Reevaluation of the patient after these medicines showed that the patient improved I have reviewed the patients home medicines and have made  adjustments as needed   Social Determinants of Health:  Former tobacco use   Test / Admission - Considered:  Physical exam is notable as above, patient appears uncomfortable and has notable cellulitic changes to her left buttocks with abscess formation that is actively draining a purulent/bloody fluid.  Patient is demonstrating a leukocytosis of 17.8, borderline febrile here with Tmax of 99.9, lactic acid normal, based on this she does not meet SIRS/septic criteria. Will proceed with CT imaging of the pelvis to further determine depth of abscess, IV antibiotics given. CT imaging is reassuring as above, does note cellulitic changes which are consistent with exam. Based on rapid progression of patient's symptoms over the course of < 48hrs with subjective fever/chills at home as well as appearance of cellulitis/abscess today with associated discomfort, I do feel that she would benefit from admission for continued IV antibiotics to ensure that her condition continues to improve.  I spoke with the hospitalist as above who is in agreement with this plan.   Staffed with Dr. Armenta  Amount and/or Complexity of Data Reviewed Labs: ordered. Radiology: ordered.  Risk OTC drugs. Prescription drug management. Decision regarding hospitalization.        Final diagnoses:  Cellulitis of buttock  Abscess    ED Discharge Orders     None          Glendia Rocky LOISE DEVONNA 11/28/24 2010    Armenta Canning, MD 12/02/24 2029  "

## 2024-11-28 NOTE — Discharge Instructions (Signed)
 Patient will need to be seen in the emergency room Recommended for further evaluation and treatment possible incision and drainage of abscess Recommend reevaluation of IV antibiotics Mother will drive patient herself does not need transportation

## 2024-11-28 NOTE — H&P (Signed)
 " History and Physical  Desiree Berger FMW:[id number] DOB: Apr 15, 1996 DOA: 11/28/2024  PCP: Pcp, No   Chief Complaint: Left buttocks bump/abscess  HPI: Desiree Berger is a 29 y.o. female with medical history significant for schizoaffective disorder, anxiety and depression, asthma, insomnia, Chiari malformation type I, allergies and obesity who presented to the ED for evaluation of left buttocks bump/abscess. Patient reports that Saturday night, she noticed a slight itch and bump near her buttocks thought to be from a bug bite which she scratched. Since then, she has had worsening pain in the area with associated redness and warmth. She also reports subjective fevers and chills. Today, the bump opened spontaneously with initial purulent and bloody drainage. Due to concern for possible abscess, she presented to the urgent care and after evaluation, she was advised to present to the ED for further evaluation and management. She denies any nausea, vomiting, abdominal pain, rectal pain, headache or dizziness.  ED Course: Initial vitals show Tmax 99.9, normotensive and 98% SpO2 on room air. Initial labs significant for WBC 17.8 otherwise unremarkable CMP, normal lactic acid and negative pregnancy test. CT pelvis shows left buttock cellulitis without evidence of abscess or fistula. Pt received IV Toradol , IV LR 1 L bolus and IV vancomycin . [facility] was consulted for admission.   Review of Systems: Please see HPI for pertinent positives and negatives. A complete 10 system review of systems are otherwise negative.  Past Medical History:  Diagnosis Date   Allergy    Anxiety    Asthma    Depression    Seasonal allergies    Past Surgical History:  Procedure Laterality Date   ADENOIDECTOMY     TONSILLECTOMY     Social History:  reports that she has quit smoking. Her smoking use included cigarettes and e-cigarettes. She has a 2.5 pack-year smoking history. She has never been exposed to  tobacco smoke. She has never used smokeless tobacco. She reports that she does not currently use alcohol. She reports that she does not currently use drugs after having used the following drugs: Marijuana.  Allergies[1]  Family History  Problem Relation Age of Onset   Hypertension Mother    Mental illness Mother    Hyperlipidemia Father    Birth defects Maternal Grandmother    Cancer Maternal Grandmother    Cancer Paternal Grandmother    Heart attack Neg Hx    Diabetes Neg Hx    Sudden death Neg Hx      Prior to Admission medications  Medication Sig Start Date End Date Taking? Authorizing Provider  ALEVE 220 MG tablet Take 220-440 mg by mouth 2 (two) times daily as needed (for pain).   Yes [provider]  etonogestrel (NEXPLANON) 68 MG IMPL implant 1 each by Subdermal route once.   Yes [provider]  lamoTRIgine  (LAMICTAL ) 25 MG tablet Take 50 mg by mouth in the morning. 11/06/24  Yes [provider]  risperiDONE  (RISPERDAL ) 1 MG tablet Take 1 mg by mouth See admin instructions. Take 1 mg by mouth in the morning and at suppertime   Yes [provider]  traZODone  (DESYREL ) 50 MG tablet Take 1 tablet (50 mg total) by mouth at bedtime as needed for sleep. Patient taking differently: Take 50 mg by mouth at bedtime. 02/24/23  Yes [name], FNP  HYDROcodone -acetaminophen  (NORCO/VICODIN) 5-325 MG tablet Take 1 tablet by mouth every 6 (six) hours as needed. Patient not taking: Reported on 11/28/2024 03/05/23   Garrick Charleston,  MD  risperiDONE  (RISPERDAL  M-TABS) 1 MG disintegrating tablet Take 1 tablet (1 mg total) by mouth 2 (two) times daily. Patient not taking: Reported on 11/28/2024 02/24/23   [name], FNP    Physical Exam: BP 129/70 (BP Location: Right Arm)   Pulse 99   Temp 98.4 F (36.9 C) (Oral)   Resp 17   SpO2 98%  General: Pleasant, well-appearing obese young woman laying in bed. No acute distress. HEENT: [place]/AT. Anicteric sclera CV:  RRR. No murmurs, rubs, or gallops. No LE edema Pulmonary: Lungs CTAB. Normal effort. No wheezing or rales. Abdominal: Soft, nontender, nondistended. Normal bowel sounds. Extremities: Palpable radial and DP pulses. Normal ROM. Skin: Warm and dry. Left buttocks with moderate erythema, tenderness and warmth. There is a small area centrally with skin sloughing and bloody drainage. No fluctuance. (See media tab) Neuro: A&Ox3. Moves all extremities. Normal sensation to light touch. No focal deficit. Psych: Normal mood and affect          Labs on Admission:  Basic Metabolic Panel: Recent Labs  Lab 11/28/24 1347  NA 136  K 4.0  CL 103  CO2 21*  GLUCOSE 82  BUN 9  CREATININE 0.67  CALCIUM 9.3   Liver Function Tests: Recent Labs  Lab 11/28/24 1347  AST 13*  ALT 10  ALKPHOS 66  BILITOT 0.4  PROT 7.2  ALBUMIN 4.1   No results for input(s): LIPASE, AMYLASE in the last 168 hours. No results for input(s): AMMONIA in the last 168 hours. CBC: Recent Labs  Lab 11/28/24 1347  WBC 17.8*  NEUTROABS 13.8*  HGB 13.6  HCT 40.1  MCV 91.6  PLT 336   Cardiac Enzymes: No results for input(s): CKTOTAL, CKMB, CKMBINDEX, TROPONINI in the last 168 hours. BNP (last 3 results) No results for input(s): BNP in the last 8760 hours.  ProBNP (last 3 results) No results for input(s): PROBNP in the last 8760 hours.  CBG: No results for input(s): GLUCAP in the last 168 hours.  Radiological Exams on Admission: CT PELVIS W CONTRAST Result Date: 11/28/2024 EXAM: CT Pelvis, With IV Contrast 11/28/2024 06:53:11 PM TECHNIQUE: Axial images were acquired through the pelvis with IV contrast. 100mL iohexol  (OMNIPAQUE ) 300 MG/ML solution was administered intravenously. Reformatted images were reviewed. Automated exposure control, iterative reconstruction, and/or weight based adjustment of the mA/kV was utilized to reduce the radiation dose to as low as reasonably achievable. COMPARISON:  CT chest abdomen and pelvis 03/04/2023 and pelvic radiographs 03/04/2023. CLINICAL HISTORY: L buttocks abscess with surrounding cellulitis, concern for deep extension of infection. Left buttocks abscess with surrounding cellulitis, concern for deep extension of infection. FINDINGS: BONES: No acute bony abnormalities. No cortical destruction or sclerosis to suggest osteomyelitis. JOINTS: No dislocation. The joint spaces are normal. SOFT TISSUES: Soft tissue infiltration in the left medial gluteal fat to the left of the gluteal crease with mild skin thickening consistent with history of cellulitis. No distinct fistula is identified. No loculated collection to suggest an abscess. INTRAPELVIC CONTENTS: Visualized portions of small and large bowel are mostly decompressed. No wall thickening or inflammatory stranding are identified. The appendix is normal. The uterus and ovaries are not enlarged. The bladder is normal. No free air or free fluid in the abdomen. IMPRESSION: 1. Left buttock cellulitis without evidence of abscess or fistula. 2. No CT evidence of osteomyelitis. Electronically signed by: Elsie Gravely MD 11/28/2024 07:28 PM EST RP Workstation: [id number]   Assessment/Plan Desiree Berger is a 29 y.o. female with  medical history significant for schizoaffective disorder, anxiety and depression, asthma, insomnia, Chiari malformation type I, allergies and obesity who presented to the ED for evaluation of left buttocks bump/abscess and admitted for cellulitis of the left buttocks.  # Cellulitis of left buttocks - Patient presented with 3 to 4 days of worsening left buttocks bump/redness with associated subjective fevers and chills - CT pelvics shows left buttock cellulitis without evidence of abscess or fistula - Patient with leukocytosis and low-grade fever but nontoxic-appearing - Reported initial purulent drainage earlier today, continue IV vancomycin  - Follow-up blood cultures - Trend CBC,  fever curve - Oxycodone  as needed for pain  # Schizoaffective disorder # Mood disorder - Continue Risperdal  and lamotrigine   # Insomnia - Continue as needed trazodone   # Asthma # Seasonal allergies - Chronic and stable  # Morbid obesity - BMI of 46.8 based on last weight of 125.6 kg - Follow-up with PCP for weight loss and nutrition counseling  DVT prophylaxis: Lovenox      Code Status: Full Code  Consults called: None  Family Communication: Discussed results/findings and plan for admission with mother at bedside  Severity of Illness: The appropriate patient status for this patient is OBSERVATION. Observation status is judged to be reasonable and necessary in order to provide the required intensity of service to ensure the patient's safety. The patient's presenting symptoms, physical exam findings, and initial radiographic and laboratory data in the context of their medical condition is felt to place them at decreased risk for further clinical deterioration. Furthermore, it is anticipated that the patient will be medically stable for discharge from the hospital within 2 midnights of admission.   Level of care: Med-Surg    Lou Claretta HERO, MD 11/28/2024, 9:39 PM [facility] Pager: [phone] Isaiah 41:10   If 7PM-7AM, please contact night-coverage [email or web address] Password [email or web address]     [1] No Known Allergies  "

## 2024-11-28 NOTE — ED Triage Notes (Signed)
 Pt sent from UC for concerns of cellulitis from abscess or bug bite on left buttock.
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
## 2024-11-28 NOTE — ED Triage Notes (Signed)
 Pt presents with an abscess inside her buttocks x 3 days. Pt has applied warm compress to the area. It started to drain last night.

## 2024-11-29 ENCOUNTER — Encounter (HOSPITAL_COMMUNITY): Payer: Self-pay | Admitting: Student

## 2024-11-29 ENCOUNTER — Ambulatory Visit (HOSPITAL_COMMUNITY): Payer: Self-pay

## 2024-11-29 DIAGNOSIS — E66813 Obesity, class 3: Secondary | ICD-10-CM

## 2024-11-29 DIAGNOSIS — F39 Unspecified mood [affective] disorder: Secondary | ICD-10-CM | POA: Diagnosis not present

## 2024-11-29 DIAGNOSIS — L03317 Cellulitis of buttock: Secondary | ICD-10-CM

## 2024-11-29 LAB — BASIC METABOLIC PANEL WITH GFR
Anion gap: 13 (ref 5–15)
BUN: 9 mg/dL (ref 6–20)
CO2: 22 mmol/L (ref 22–32)
Calcium: 9.1 mg/dL (ref 8.9–10.3)
Chloride: 106 mmol/L (ref 98–111)
Creatinine, Ser: 0.84 mg/dL (ref 0.44–1.00)
GFR, Estimated: >60 mL/min
Glucose, Bld: 89 mg/dL (ref 70–99)
Potassium: 4.1 mmol/L (ref 3.5–5.1)
Sodium: 140 mmol/L (ref 135–145)

## 2024-11-29 LAB — CBC
HCT: 38.9 % (ref 36.0–46.0)
Hemoglobin: 13 g/dL (ref 12.0–15.0)
MCH: 31 pg (ref 26.0–34.0)
MCHC: 33.4 g/dL (ref 30.0–36.0)
MCV: 92.8 fL (ref 80.0–100.0)
Platelets: 309 10*3/uL (ref 150–400)
RBC: 4.19 MIL/uL (ref 3.87–5.11)
RDW: 13 % (ref 11.5–15.5)
WBC: 13.2 10*3/uL — ABNORMAL HIGH (ref 4.0–10.5)
nRBC: 0 % (ref 0.0–0.2)

## 2024-11-29 MED ORDER — PANTOPRAZOLE SODIUM 40 MG PO TBEC
40.0000 mg | DELAYED_RELEASE_TABLET | Freq: Every day | ORAL | Status: DC
  Administered 2024-11-29 – 2024-11-30 (×2): 40 mg via ORAL
  Filled 2024-11-29 (×2): qty 1

## 2024-11-29 MED ORDER — GERHARDT'S BUTT CREAM
1.0000 | TOPICAL_CREAM | Freq: Every day | CUTANEOUS | Status: DC
  Administered 2024-11-29 – 2024-11-30 (×2): 1 via TOPICAL
  Filled 2024-11-29: qty 60

## 2024-11-29 MED ORDER — COLLAGENASE 250 UNIT/GM EX OINT
TOPICAL_OINTMENT | Freq: Every day | CUTANEOUS | Status: DC
  Filled 2024-11-29: qty 30

## 2024-11-29 MED ORDER — VANCOMYCIN HCL 1250 MG/250ML IV SOLN
1250.0000 mg | Freq: Two times a day (BID) | INTRAVENOUS | Status: DC
  Administered 2024-11-29 – 2024-11-30 (×3): 1250 mg via INTRAVENOUS
  Filled 2024-11-29 (×3): qty 250

## 2024-11-29 MED ORDER — IBUPROFEN 200 MG PO TABS
400.0000 mg | ORAL_TABLET | Freq: Four times a day (QID) | ORAL | Status: DC | PRN
  Administered 2024-11-29 – 2024-11-30 (×3): 400 mg via ORAL
  Filled 2024-11-29 (×3): qty 2

## 2024-11-29 MED ORDER — MEDIHONEY WOUND/BURN DRESSING EX PSTE
1.0000 | PASTE | Freq: Every day | CUTANEOUS | Status: DC
  Filled 2024-11-29: qty 44

## 2024-11-29 NOTE — Progress Notes (Signed)
 Pharmacy Antibiotic Note  Desiree Berger is a 29 y.o. female admitted on 11/28/2024 with L buttocks cellulitis.  Pharmacy has been consulted for Vancomycin  dosing.  Plan: Vancomycin  1250mg  IV q12h for estimated AUC 474 (goal 400-550) Monitor renal function and cx data      Temp (24hrs), Avg:98.8 F (37.1 C), Min:97.6 F (36.4 C), Max:99.9 F (37.7 C)  Recent Labs  Lab 11/28/24 1347  WBC 17.8*  CREATININE 0.67  LATICACIDVEN 0.8    CrCl cannot be calculated (Unknown ideal weight.).    Allergies[1]  Antimicrobials this admission: 1/27 Vancomycin  >>   Dose adjustments this admission:  Microbiology results: 1/27 BCx:   Thank you for allowing pharmacy to be a part of this patients care.  Rosaline Millet PharmD 11/29/2024 1:03 AM     [1] No Known Allergies

## 2024-11-29 NOTE — Progress Notes (Signed)
 " PROGRESS NOTE    Desiree Berger  FMW:[id number] DOB: 01-03-1996 DOA: 11/28/2024 PCP: Pcp, No    No chief complaint on file.   Brief Narrative:  Patient 29 year old female history of schizoaffective disorder, anxiety, depression, asthma, insomnia, Chiari malformation type I, obesity presented to the ED for evaluation of left buttocks bump/abscess and admitted for cellulitis of the left buttocks.   Assessment & Plan:   Principal Problem:   Cellulitis of left buttock Active Problems:   Mood disorder   Obesity, Class III, BMI 40-49.9 (morbid obesity) (HCC)  #1 cellulitis of the left buttocks -Patient noted to have presented with 3 to 4-day history of worsening left buttocks bump/redness with associated subjective fevers and chills - CT pelvis showed left buttock cellulitis without evidence of abscess or fistula formation.  No CT evidence of osteomyelitis. - Patient noted with a leukocytosis low-grade fever but nontoxic-appearing. - Patient noted to have had some initial purulent drainage however drainage currently sanguinous. - Leukocytosis trending down. - Blood cultures obtained with no growth to date. - Afebrile. - Continue IV vancomycin .  2.  Schizoaffective disorder disorder/mood disorder -Stable. - Continue home regimen of Risperdal  and lamotrigine . - Outpatient follow-up.  3.  Insomnia -Continue trazodone  as needed.  4.  Asthma/seasonal allergies -Stable.  5.  Morbid obesity -BMI 44.44 kg/m - Lifestyle modification - Outpatient follow-up with PCP.   DVT prophylaxis: Lovenox  Code Status: Full Family Communication: Updated patient and mother at bedside. Disposition: Likely home when clinically improved hopefully in the next 24 to 48 hours  Status is: Observation The patient remains OBS appropriate and will d/c before 2 midnights.   Consultants:  None  Procedures:  CT pelvis 11/28/2024   Antimicrobials:  Anti-infectives (From admission,  onward)    Start     Dose/Rate Route Frequency Ordered Stop   11/29/24 1000  vancomycin  (VANCOREADY) IVPB 1250 mg/250 mL        1,250 mg 166.7 mL/hr over 90 Minutes Intravenous Every 12 hours 11/29/24 0107     11/28/24 1730  vancomycin  (VANCOCIN ) IVPB 1000 mg/200 mL premix  Status:  Discontinued        1,000 mg 200 mL/hr over 60 Minutes Intravenous  Once 11/28/24 1716 11/28/24 1720   11/28/24 1730  vancomycin  (VANCOREADY) IVPB 2000 mg/400 mL        2,000 mg 200 mL/hr over 120 Minutes Intravenous  Once 11/28/24 1720 11/28/24 2002         Subjective: Patient laying in bed.  Patient with complaints of left gluteus pain which has improved somewhat since admission.  Still with bloody drainage per patient.  Mother at bedside.  Objective: Vitals:   11/29/24 0329 11/29/24 0500 11/29/24 0910 11/29/24 1406  BP:  (!) 115/58 (!) 150/65 (!) 150/90  Pulse:  70 79 95  Resp:  18 16 18   Temp:  98.5 F (36.9 C) 98.5 F (36.9 C) 98.2 F (36.8 C)  TempSrc:  Oral Oral Oral  SpO2:  99%    Weight: 124.9 kg     Height: 5' 6 (1.676 m)       Intake/Output Summary (Last 24 hours) at 11/29/2024 1731 Last data filed at 11/29/2024 1343 Gross per 24 hour  Intake 1650 ml  Output --  Net 1650 ml   Filed Weights   11/29/24 0329  Weight: 124.9 kg    Examination:  General exam: Appears calm and comfortable  Respiratory system: Clear to auscultation. Respiratory effort normal. Cardiovascular system: S1 & S2  heard, RRR. No JVD, murmurs, rubs, gallops or clicks. No pedal edema. Gastrointestinal system: Abdomen is nondistended, soft and nontender. No organomegaly or masses felt. Normal bowel sounds heard. Central nervous system: Alert and oriented. No focal neurological deficits. Extremities: Symmetric 5 x 5 power. Skin: Left gluteus with erythema, warmth, tender to palpation, area of sanguinous drainage noted.  No significant induration noted. Psychiatry: Judgement and insight appear normal. Mood &  affect appropriate.     Data Reviewed: I have personally reviewed following labs and imaging studies  CBC: Recent Labs  Lab 11/28/24 1347 11/29/24 0603  WBC 17.8* 13.2*  NEUTROABS 13.8*  --   HGB 13.6 13.0  HCT 40.1 38.9  MCV 91.6 92.8  PLT 336 309    Basic Metabolic Panel: Recent Labs  Lab 11/28/24 1347 11/29/24 0603  NA 136 140  K 4.0 4.1  CL 103 106  CO2 21* 22  GLUCOSE 82 89  BUN 9 9  CREATININE 0.67 0.84  CALCIUM 9.3 9.1    GFR: Estimated Creatinine Clearance: 137 mL/min (by C-G formula based on SCr of 0.84 mg/dL).  Liver Function Tests: Recent Labs  Lab 11/28/24 1347  AST 13*  ALT 10  ALKPHOS 66  BILITOT 0.4  PROT 7.2  ALBUMIN 4.1    CBG: No results for input(s): GLUCAP in the last 168 hours.   Recent Results (from the past 240 hours)  Blood culture (routine x 2)     Status: None (Preliminary result)   Collection Time: 11/28/24  5:20 PM   Specimen: BLOOD LEFT ARM  Result Value Ref Range Status   Specimen Description   Final    BLOOD LEFT ARM Performed at [facility], [address] [place], [place] [address]    Special Requests   Final    BOTTLES DRAWN AEROBIC AND ANAEROBIC Blood Culture adequate volume Performed at [facility], [address], [place], [place] [address]    Culture   Final    NO GROWTH < 12 HOURS Performed at [facility], [address] [place], [place] [address]    Report Status PENDING  Incomplete  Blood culture (routine x 2)     Status: None (Preliminary result)   Collection Time: 11/28/24  5:40 PM   Specimen: BLOOD LEFT ARM  Result Value Ref Range Status   Specimen Description   Final    BLOOD LEFT ARM Performed at [facility], [address], [place], [place] [address]    Special Requests   Final    BOTTLES DRAWN AEROBIC AND ANAEROBIC Blood Culture adequate volume Performed at [facility], [address], [place], [place] [address]    Culture    Final    NO GROWTH < 12 HOURS Performed at [facility], [address], [place], [place] [address]    Report Status PENDING  Incomplete         Radiology Studies: CT PELVIS W CONTRAST Result Date: 11/28/2024 EXAM: CT Pelvis, With IV Contrast 11/28/2024 06:53:11 PM TECHNIQUE: Axial images were acquired through the pelvis with IV contrast. 100mL iohexol  (OMNIPAQUE ) 300 MG/ML solution was administered intravenously. Reformatted images were reviewed. Automated exposure control, iterative reconstruction, and/or weight based adjustment of the mA/kV was utilized to reduce the radiation dose to as low as reasonably achievable. COMPARISON: CT chest abdomen and pelvis 03/04/2023 and pelvic radiographs 03/04/2023. CLINICAL HISTORY: L buttocks abscess with surrounding cellulitis, concern for deep extension of infection.  Left buttocks abscess with surrounding cellulitis, concern for deep extension of infection. FINDINGS: BONES: No acute bony abnormalities. No cortical destruction or sclerosis to suggest osteomyelitis. JOINTS: No dislocation. The joint spaces are normal. SOFT TISSUES: Soft tissue infiltration in the left medial gluteal fat to the left of the gluteal crease with mild skin thickening consistent with history of cellulitis. No distinct fistula is identified. No loculated collection to suggest an abscess. INTRAPELVIC CONTENTS: Visualized portions of small and large bowel are mostly decompressed. No wall thickening or inflammatory stranding are identified. The appendix is normal. The uterus and ovaries are not enlarged. The bladder is normal. No free air or free fluid in the abdomen. IMPRESSION: 1. Left buttock cellulitis without evidence of abscess or fistula. 2. No CT evidence of osteomyelitis. Electronically signed by: Elsie Gravely MD 11/28/2024 07:28 PM EST RP Workstation: [id number]        Scheduled Meds:  collagenase    Topical Daily   enoxaparin  (LOVENOX ) injection  60 mg  Subcutaneous Q24H   Gerhardt's butt cream  1 Application Topical Daily   lamoTRIgine   50 mg Oral Daily   pantoprazole   40 mg Oral Q0600   risperiDONE   1 mg Oral BID   Continuous Infusions:  vancomycin  Stopped (11/29/24 1058)     LOS: 0 days    Time spent: 40 minutes    Toribio Hummer, MD [facility]   To contact the attending provider between 7A-7P or the covering provider during after hours 7P-7A, please log into the web site [email or web address] and access using universal [place] password for that web site. If you do not have the password, please call the hospital operator.  11/29/2024, 5:31 PM    "

## 2024-11-29 NOTE — Consult Note (Signed)
 WOC Nurse Consult Note: Reason for Consult:Patient presented with 3 to 4 days of worsening left buttocks bump/redness with associated subjective fevers and chills - CT pelvics shows left buttock cellulitis without evidence of abscess or fistula Is currently on vancomycin  Wound type: infectious Pressure Injury POA: NA Measurement: 3 cm x 3 cm (per flowsheet) with slough to wound bed and peeling epithelium to upper left buttock Right gluteal fold, lower, near rectum,  with nonblanchable erythema to  skin   Wound bed: slough to left  Drainage (amount, consistency, odor) minimal purulence Periwound: erythema, warmth and tenderness Dressing procedure/placement/frequency: Cleanse bilateral gluteal folds with VASHE (WD#  [id number]) and air dry. Apply santyl  to open wound and cover with NS moist gauze. Secure with silicone foam.  GErhardts paste to right gluteal fold. If skin peels away and is nonintact, may use santyl  to both sides.  Will not follow at this time.  Please re-consult if needed.  Darice Cooley MSN, RN, FNP-BC CWON Wound, Ostomy, Continence Nurse Outpatient [facility] [phone] Work cell phone:  [phone]

## 2024-11-30 ENCOUNTER — Other Ambulatory Visit (HOSPITAL_COMMUNITY): Payer: Self-pay

## 2024-11-30 DIAGNOSIS — L03317 Cellulitis of buttock: Secondary | ICD-10-CM | POA: Diagnosis not present

## 2024-11-30 DIAGNOSIS — E66813 Obesity, class 3: Secondary | ICD-10-CM | POA: Diagnosis not present

## 2024-11-30 DIAGNOSIS — F39 Unspecified mood [affective] disorder: Secondary | ICD-10-CM | POA: Diagnosis not present

## 2024-11-30 LAB — BASIC METABOLIC PANEL WITH GFR
Anion gap: 11 (ref 5–15)
BUN: 9 mg/dL (ref 6–20)
CO2: 21 mmol/L — ABNORMAL LOW (ref 22–32)
Calcium: 9.1 mg/dL (ref 8.9–10.3)
Chloride: 108 mmol/L (ref 98–111)
Creatinine, Ser: 0.85 mg/dL (ref 0.44–1.00)
GFR, Estimated: >60 mL/min
Glucose, Bld: 106 mg/dL — ABNORMAL HIGH (ref 70–99)
Potassium: 4 mmol/L (ref 3.5–5.1)
Sodium: 140 mmol/L (ref 135–145)

## 2024-11-30 LAB — CBC WITH DIFFERENTIAL/PLATELET
Abs Immature Granulocytes: 0.03 10*3/uL (ref 0.00–0.07)
Basophils Absolute: 0 10*3/uL (ref 0.0–0.1)
Basophils Relative: 0 %
Eosinophils Absolute: 0.3 10*3/uL (ref 0.0–0.5)
Eosinophils Relative: 3 %
HCT: 38.6 % (ref 36.0–46.0)
Hemoglobin: 12.4 g/dL (ref 12.0–15.0)
Immature Granulocytes: 0 %
Lymphocytes Relative: 32 %
Lymphs Abs: 2.7 10*3/uL (ref 0.7–4.0)
MCH: 30.1 pg (ref 26.0–34.0)
MCHC: 32.1 g/dL (ref 30.0–36.0)
MCV: 93.7 fL (ref 80.0–100.0)
Monocytes Absolute: 0.5 10*3/uL (ref 0.1–1.0)
Monocytes Relative: 6 %
Neutro Abs: 4.9 10*3/uL (ref 1.7–7.7)
Neutrophils Relative %: 59 %
Platelets: 353 10*3/uL (ref 150–400)
RBC: 4.12 MIL/uL (ref 3.87–5.11)
RDW: 12.8 % (ref 11.5–15.5)
WBC: 8.3 10*3/uL (ref 4.0–10.5)
nRBC: 0 % (ref 0.0–0.2)

## 2024-11-30 LAB — MISC LABCORP TEST (SEND OUT): Labcorp test code: 83935

## 2024-11-30 MED ORDER — LINEZOLID 600 MG PO TABS
600.0000 mg | ORAL_TABLET | Freq: Two times a day (BID) | ORAL | 0 refills | Status: AC
  Filled 2024-11-30: qty 16, 8d supply, fill #0

## 2024-11-30 MED ORDER — CARMEX CLASSIC LIP BALM EX OINT
1.0000 | TOPICAL_OINTMENT | CUTANEOUS | Status: DC | PRN
  Filled 2024-11-30: qty 10

## 2024-11-30 MED ORDER — ACETAMINOPHEN 325 MG PO TABS: 650.0000 mg | ORAL_TABLET | Freq: Four times a day (QID) | ORAL | Status: AC | PRN

## 2024-11-30 MED ORDER — PANTOPRAZOLE SODIUM 40 MG PO TBEC
40.0000 mg | DELAYED_RELEASE_TABLET | Freq: Every day | ORAL | 0 refills | Status: AC
  Filled 2024-11-30: qty 30, 30d supply, fill #0

## 2024-11-30 MED ORDER — COLLAGENASE 250 UNIT/GM EX OINT
TOPICAL_OINTMENT | Freq: Every day | CUTANEOUS | 0 refills | Status: DC
  Filled 2024-11-30: qty 30, 7d supply, fill #0

## 2024-11-30 MED ORDER — GERHARDT'S BUTT CREAM: 1.0000 | TOPICAL_CREAM | Freq: Every day | CUTANEOUS | Status: AC

## 2024-11-30 MED ORDER — GERHARDT'S BUTT CREAM
1.0000 | TOPICAL_CREAM | Freq: Every day | CUTANEOUS | 0 refills | Status: DC
  Filled 2024-11-30: qty 60, 30d supply, fill #0

## 2024-11-30 MED ORDER — COLLAGENASE 250 UNIT/GM EX OINT: TOPICAL_OINTMENT | Freq: Every day | CUTANEOUS | Status: AC
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
## 2024-11-30 NOTE — Plan of Care (Signed)

## 2024-11-30 NOTE — Progress Notes (Signed)
 Discharge meds in a secure bag delivered to patient by this RN.  AVS reviewed with patient who verbalized an understanding. PIV removed as noted. Work noted provided. No other questions at this time. Mom enroute

## 2024-11-30 NOTE — Discharge Summary (Addendum)
 Physician Discharge Summary  Desiree Berger FMW:[id number] DOB: [birth year] DOA: 11/28/2024  PCP: Pcp, No  Admit date: 11/28/2024 Discharge date: 11/30/2024  Time spent: 60 minutes  Recommendations for Outpatient Follow-up:  Follow-up with PCP in 2 weeks.  On follow-up patient's gluteal cellulitis will need to be reassessed.   Discharge Diagnoses:  Principal Problem:   Cellulitis of left buttock Active Problems:   Mood disorder   Obesity, Class III, BMI 40-49.9 (morbid obesity) (HCC)   Discharge Condition: Stable and improved.  Diet recommendation: Regular  Filed Weights   11/29/24 0329  Weight: 124.9 kg    History of present illness:  HPI per Dr. Lou Comer Joslyn Ramos is a 29 y.o. female with medical history significant for schizoaffective disorder, anxiety and depression, asthma, insomnia, Chiari malformation type I, allergies and obesity who presented to the ED for evaluation of left buttocks bump/abscess. Patient reports that Saturday night, she noticed a slight itch and bump near her buttocks thought to be from a bug bite which she scratched. Since then, she has had worsening pain in the area with associated redness and warmth. She also reports subjective fevers and chills. Today, the bump opened spontaneously with initial purulent and bloody drainage. Due to concern for possible abscess, she presented to the urgent care and after evaluation, she was advised to present to the ED for further evaluation and management. She denies any nausea, vomiting, abdominal pain, rectal pain, headache or dizziness.   ED Course: Initial vitals show Tmax 99.9, normotensive and 98% SpO2 on room air. Initial labs significant for WBC 17.8 otherwise unremarkable CMP, normal lactic acid and negative pregnancy test. CT pelvis shows left buttock cellulitis without evidence of abscess or fistula. Pt received IV Toradol , IV LR 1 L bolus and IV vancomycin . [facility] was consulted for  admission.   Hospital Course:  #1 cellulitis of the left buttocks -Patient noted to have presented with 3 to 4-day history of worsening left buttocks bump/redness with associated subjective fevers and chills - CT pelvis showed left buttock cellulitis without evidence of abscess or fistula formation.  No CT evidence of osteomyelitis. - Patient noted with a leukocytosis low-grade fever but nontoxic-appearing. - Patient noted to have had some initial purulent drainage however drainage subsequently became sanguinous. - Leukocytosis trended down and leukocytosis had resolved by day of discharge. - Patient remained afebrile throughout the hospitalization. - Blood cultures obtained with no growth to date x 2 days during the hospitalization.  - Patient maintained on IV vancomycin  and improved clinically during the hospitalization.  - Patient will be discharged on 8 more days of Zyvox  to complete a 10-day course of antibiotic treatment.  - Outpatient follow-up with PCP.    2.  Schizoaffective disorder disorder/mood disorder -Stable. - Patient maintained on home regimen of Risperdal  and lamotrigine . - Outpatient follow-up.   3.  Insomnia - Patient maintained on home regimen trazodone  as needed.   4.  Asthma/seasonal allergies -Stable.   5.  Morbid obesity -BMI 44.44 kg/m - Lifestyle modification - Outpatient follow-up with PCP.  Procedures: CT pelvis 11/28/2024  Consultations: Curbside ID  Discharge Exam: Vitals:   11/30/24 0517 11/30/24 1315  BP: 122/71 123/77  Pulse: 68 87  Resp: 17 18  Temp: 98 F (36.7 C) 98.6 F (37 C)  SpO2:  98%    General: NAD Cardiovascular: RRR no murmurs rubs or gallops.  No JVD.  No lower extremity edema. Respiratory: Clear to auscultation bilaterally.  No wheezes, no crackles, no rhonchi.  Fair air movement.  Speaking in full sentences. Skin: Left gluteal region with significantly decreased erythema, less warmth, less tender to palpation, no  significant induration noted, sanguinous drainage noted.  Discharge Instructions   Discharge Instructions     Diet general   Complete by: As directed    Discharge wound care:   Complete by: As directed    Wound care  Daily      Comments: Cleanse bilateral gluteal folds with VASHE (WD#  [id number]) and air dry. Apply santyl  to open wound and cover with NS moist gauze. Secure with silicone foam.  GErhardts paste to right gluteal fold. If skin peels away and is nonintact, may use santyl  to both sides.   Increase activity slowly   Complete by: As directed       Allergies as of 11/30/2024   No Known Allergies      Medication List     STOP taking these medications    HYDROcodone -acetaminophen  5-325 MG tablet Commonly known as: NORCO/VICODIN       TAKE these medications    acetaminophen  325 MG tablet Commonly known as: TYLENOL  Take 2 tablets (650 mg total) by mouth every 6 (six) hours as needed for mild pain (pain score 1-3) or fever (or Fever >/= 101).   Aleve 220 MG tablet Generic drug: naproxen sodium Take 220-440 mg by mouth 2 (two) times daily as needed (for pain).   collagenase  250 UNIT/GM ointment Commonly known as: SANTYL  Apply topically daily for 7 days. Apply to affected area Start taking on: December 01, 2024   etonogestrel 68 MG Impl implant Commonly known as: NEXPLANON 1 each by Subdermal route once.   Gerhardt's butt cream Crea Apply 1 Application topically daily for 7 days. Apply to right gluteal fold erythema Start taking on: December 01, 2024   lamoTRIgine  25 MG tablet Commonly known as: LAMICTAL  Take 50 mg by mouth in the morning.   linezolid  600 MG tablet Commonly known as: ZYVOX  Take 1 tablet (600 mg total) by mouth 2 (two) times daily for 8 days.   pantoprazole  40 MG tablet Commonly known as: PROTONIX  Take 1 tablet (40 mg total) by mouth daily at 6 (six) AM. Start taking on: December 01, 2024   risperiDONE  1 MG tablet Commonly known as:  RISPERDAL  Take 1 mg by mouth See admin instructions. Take 1 mg by mouth in the morning and at suppertime   risperiDONE  1 MG disintegrating tablet Commonly known as: RISPERDAL  M-TABS Take 1 tablet (1 mg total) by mouth 2 (two) times daily.   traZODone  50 MG tablet Commonly known as: DESYREL  Take 1 tablet (50 mg total) by mouth at bedtime as needed for sleep. What changed: when to take this               Discharge Care Instructions  (From admission, onward)           Start     Ordered   11/30/24 0000  Discharge wound care:       Comments: Wound care  Daily      Comments: Cleanse bilateral gluteal folds with VASHE (WD#  [id number]) and air dry. Apply santyl  to open wound and cover with NS moist gauze. Secure with silicone foam.  GErhardts paste to right gluteal fold. If skin peels away and is nonintact, may use santyl  to both sides.   11/30/24 1316           Allergies[1]  Follow-up Information     PCP. Schedule  an appointment as soon as possible for a visit in 2 week(s).                   The results of significant diagnostics from this hospitalization (including imaging, microbiology, ancillary and laboratory) are listed below for reference.    Significant Diagnostic Studies: CT PELVIS W CONTRAST Result Date: 11/28/2024 EXAM: CT Pelvis, With IV Contrast 11/28/2024 06:53:11 PM TECHNIQUE: Axial images were acquired through the pelvis with IV contrast. iohexol  (OMNIPAQUE ) 300 MG/ML solution was administered intravenously. Reformatted images were reviewed. Automated exposure control, iterative reconstruction, and/or weight based adjustment of the mA/kV was utilized to reduce the radiation dose to as low as reasonably achievable. COMPARISON: CT chest abdomen and pelvis 03/04/2023 and pelvic radiographs 03/04/2023. CLINICAL HISTORY: L buttocks abscess with surrounding cellulitis, concern for deep extension of infection. Left buttocks abscess with surrounding  cellulitis, concern for deep extension of infection. FINDINGS: BONES: No acute bony abnormalities. No cortical destruction or sclerosis to suggest osteomyelitis. JOINTS: No dislocation. The joint spaces are normal. SOFT TISSUES: Soft tissue infiltration in the left medial gluteal fat to the left of the gluteal crease with mild skin thickening consistent with history of cellulitis. No distinct fistula is identified. No loculated collection to suggest an abscess. INTRAPELVIC CONTENTS: Visualized portions of small and large bowel are mostly decompressed. No wall thickening or inflammatory stranding are identified. The appendix is normal. The uterus and ovaries are not enlarged. The bladder is normal. No free air or free fluid in the abdomen. IMPRESSION: 1. Left buttock cellulitis without evidence of abscess or fistula. 2. No CT evidence of osteomyelitis. Electronically signed by: Elsie Gravely MD 11/28/2024 07:28 PM EST RP Workstation: [id number]    Microbiology: Recent Results (from the past 240 hours)  Blood culture (routine x 2)     Status: None (Preliminary result)   Collection Time: 11/28/24  5:20 PM   Specimen: BLOOD LEFT ARM  Result Value Ref Range Status   Specimen Description   Final    BLOOD LEFT ARM Performed at [facility], [address] [place], [place] [address]    Special Requests   Final    BOTTLES DRAWN AEROBIC AND ANAEROBIC Blood Culture adequate volume Performed at [facility], [address], [place], [place] [address]    Culture   Final    NO GROWTH 2 DAYS Performed at [facility], [address] [place], [place] [address]    Report Status PENDING  Incomplete  Blood culture (routine x 2)     Status: None (Preliminary result)   Collection Time: 11/28/24  5:40 PM   Specimen: BLOOD LEFT ARM  Result Value Ref Range Status   Specimen Description   Final    BLOOD LEFT ARM Performed at [facility], [address] [place],  [place] [address]    Special Requests   Final    BOTTLES DRAWN AEROBIC AND ANAEROBIC Blood Culture adequate volume Performed at [facility], [address] [place], [place] [address]    Culture   Final    NO GROWTH 2 DAYS Performed at [facility], [address] [place], [place] [address]    Report Status PENDING  Incomplete     Labs: Basic Metabolic Panel: Recent Labs  Lab 11/28/24 1347 11/29/24 0603 11/30/24 0859  NA 136 140 140  K 4.0 4.1 4.0  CL 103 106 108  CO2 21* 22 21*  GLUCOSE 82 89 106*  BUN 9 9 9   CREATININE 0.67 0.84 0.85  CALCIUM 9.3 9.1 9.1   Liver Function Tests: Recent Labs  Lab 11/28/24 1347  AST 13*  ALT 10  ALKPHOS 66  BILITOT 0.4  PROT 7.2  ALBUMIN 4.1   No results for input(s): LIPASE, AMYLASE in the last 168 hours. No results for input(s): AMMONIA in the last 168 hours. CBC: Recent Labs  Lab 11/28/24 1347 11/29/24 0603 11/30/24 0859  WBC 17.8* 13.2* 8.3  NEUTROABS 13.8*  --  4.9  HGB 13.6 13.0 12.4  HCT 40.1 38.9 38.6  MCV 91.6 92.8 93.7  PLT 336 309 353   Cardiac Enzymes: No results for input(s): CKTOTAL, CKMB, CKMBINDEX, TROPONINI in the last 168 hours. BNP: BNP (last 3 results) No results for input(s): BNP in the last 8760 hours.  ProBNP (last 3 results) No results for input(s): PROBNP in the last 8760 hours.  CBG: No results for input(s): GLUCAP in the last 168 hours.     Signed:  Toribio Hummer MD.  [facility] 11/30/2024, 1:29 PM        [1] No Known Allergies

## 2024-12-03 LAB — CULTURE, BLOOD (ROUTINE X 2)
Culture: NO GROWTH
Culture: NO GROWTH
Special Requests: ADEQUATE
Special Requests: ADEQUATE
